# Patient Record
Sex: Female | Born: 1950 | Race: White | Hispanic: No | Smoking: Former smoker
Health system: Southern US, Community
[De-identification: ages and names within clinical notes are randomized; demographics above are authoritative.]

## PROBLEM LIST (undated history)

## (undated) DIAGNOSIS — F32A Depression, unspecified: Secondary | ICD-10-CM

## (undated) DIAGNOSIS — Z973 Presence of spectacles and contact lenses: Secondary | ICD-10-CM

## (undated) DIAGNOSIS — Z720 Tobacco use: Secondary | ICD-10-CM

## (undated) DIAGNOSIS — F329 Major depressive disorder, single episode, unspecified: Secondary | ICD-10-CM

## (undated) DIAGNOSIS — J439 Emphysema, unspecified: Secondary | ICD-10-CM

## (undated) DIAGNOSIS — F419 Anxiety disorder, unspecified: Secondary | ICD-10-CM

## (undated) DIAGNOSIS — Z972 Presence of dental prosthetic device (complete) (partial): Secondary | ICD-10-CM

## (undated) DIAGNOSIS — G4700 Insomnia, unspecified: Secondary | ICD-10-CM

## (undated) HISTORY — PX: WISDOM TOOTH EXTRACTION: SHX21

## (undated) HISTORY — DX: Emphysema, unspecified: J43.9

## (undated) HISTORY — DX: Tobacco use: Z72.0

## (undated) HISTORY — DX: Insomnia, unspecified: G47.00

## (undated) HISTORY — PX: TOOTH EXTRACTION: SUR596

## (undated) HISTORY — DX: Major depressive disorder, single episode, unspecified: F32.9

## (undated) HISTORY — DX: Anxiety disorder, unspecified: F41.9

## (undated) HISTORY — DX: Depression, unspecified: F32.A

---

## 2007-06-18 ENCOUNTER — Ambulatory Visit: Payer: Self-pay | Admitting: Cardiovascular Disease

## 2007-06-18 ENCOUNTER — Inpatient Hospital Stay (HOSPITAL_COMMUNITY): Admission: EM | Admit: 2007-06-18 | Discharge: 2007-06-20 | Payer: Self-pay | Admitting: Emergency Medicine

## 2007-06-29 ENCOUNTER — Ambulatory Visit: Payer: Self-pay

## 2007-07-08 ENCOUNTER — Ambulatory Visit: Payer: Self-pay | Admitting: Internal Medicine

## 2008-10-07 ENCOUNTER — Emergency Department: Payer: Self-pay

## 2009-04-19 ENCOUNTER — Ambulatory Visit (HOSPITAL_COMMUNITY): Admission: RE | Admit: 2009-04-19 | Discharge: 2009-04-19 | Payer: Self-pay | Admitting: Internal Medicine

## 2009-04-24 ENCOUNTER — Other Ambulatory Visit: Admission: RE | Admit: 2009-04-24 | Discharge: 2009-04-24 | Payer: Self-pay | Admitting: Internal Medicine

## 2010-01-15 ENCOUNTER — Encounter (INDEPENDENT_AMBULATORY_CARE_PROVIDER_SITE_OTHER): Payer: Self-pay | Admitting: *Deleted

## 2010-05-10 ENCOUNTER — Encounter (INDEPENDENT_AMBULATORY_CARE_PROVIDER_SITE_OTHER): Payer: Self-pay | Admitting: *Deleted

## 2010-09-22 ENCOUNTER — Encounter: Payer: Self-pay | Admitting: Internal Medicine

## 2010-10-01 NOTE — Letter (Signed)
Summary: Pre Visit No Show Letter  [facility]  [address] [place], [place] [address]   Phone: [phone]  Fax: [phone]        May 10, 2010 MRN: [id number]    Jordan Cantu [address] [place], [place]  [address]    Dear [name],   We have been unable to reach you by phone concerning the pre-procedure visit that you missed on  05/10/2010. For this reason,your procedure scheduled on Friday 05/24/2010 has been cancelled. Our scheduling staff will gladly assist you with rescheduling your appointments at a more convenient time. Please call our office at [phone] between the hours of 8:00am and 5:00pm, press option #2 to reach an appointment scheduler. Please consider updating your contact numbers at this time so that we can reach you by phone in the future with schedule changes or results.    Thank you,    Ezra Sites RN [facility]

## 2010-10-01 NOTE — Letter (Signed)
Summary: Previsit letter  [facility]  [address] [place], [place] [address]   Phone: [phone]  Fax: [phone]       01/15/2010 MRN: [id number]  Jordan Cantu [address] [place], [place]  [address]  Dear Ms. [name],  Welcome to the Gastroenterology Division at [facility]    You are scheduled to see a nurse for your pre-procedure visit on 01-22-10 at 8:00a.m. on the 3rd floor at [facility], [address]  We ask that you try to arrive at our office 15 minutes prior to your appointment time to allow for check-in.  Your nurse visit will consist of discussing your medical and surgical history, your immediate family medical history, and your medications.    Please bring a complete list of all your medications or, if you prefer, bring the medication bottles and we will list them.  We will need to be aware of both prescribed and over the counter drugs.  We will need to know exact dosage information as well.  If you are on blood thinners (Coumadin, Plavix, Aggrenox, Ticlid, etc.) please call our office today/prior to your appointment, as we need to consult with your physician about holding your medication.   Please be prepared to read and sign documents such as consent forms, a financial agreement, and acknowledgement forms.  If necessary, and with your consent, a friend or relative is welcome to sit-in on the nurse visit with you.  Please bring your insurance card so that we may make a copy of it.  If your insurance requires a referral to see a specialist, please bring your referral form from your primary care physician.  No co-pay is required for this nurse visit.     If you cannot keep your appointment, please call [phone] to cancel or reschedule prior to your appointment date.  This allows [place] the opportunity to schedule an appointment for another patient in need of care.    Thank you for choosing [facility] for your medical  needs.  We appreciate the opportunity to care for you.  Please visit [place] at our website  to learn more about our practice.                     Sincerely.                                                                                                                   The Gastroenterology Division

## 2011-01-14 NOTE — H&P (Signed)
NAME:  Jordan Cantu, COZZA.:  [id number]   MEDICAL RECORD NO.:  [id number]          PATIENT TYPE:  EMS   LOCATION:  MAJO                         FACILITY:  [facility]   PHYSICIAN:  Hillery Aldo, M.D.   DATE OF BIRTH:  1951-03-03   DATE OF ADMISSION:  06/18/2007  DATE OF DISCHARGE:                              HISTORY & PHYSICAL   PRIMARY CARE PHYSICIAN:  Dr. Loma Sender in [place], [place]  [place].   CHIEF COMPLAINT:  Chest pain radiating to the jaw.   HISTORY OF PRESENT ILLNESS:  The patient is a 60 year old female tobacco  abuser who presents with the sudden onset of chest pain, nonexertional,  while at work yesterday.  The patient states that the pain lasted  approximately 10-15 minutes and was substernal and sharp in quality.  The pain radiated up to both sides of her jaw.  It was associated with  some diaphoresis, but no nausea or vomiting.  No shortness of breath.  She now had some residual soreness of the chest, and her family has  persuaded her to come into the emergency department for evaluation.  She  is currently chest pain free.  She is being admitted for further  evaluation and workup.   PAST MEDICAL HISTORY:  Anxiety.   FAMILY HISTORY:  The patient's mother is alive at age 36 and suffers  with hypertension.  The patient's father died at 41 from complications  of chronic obstructive pulmonary disease.  She has two healthy siblings.   SOCIAL HISTORY:  The patient is divorced and currently lives with her  mother.  She smokes a pack of tobacco daily and has done so for 35  years.  She drinks 1-2 alcoholic beverages daily.  She denies any drug  use.  She works at [employer] as a Occupational psychologist.   ALLERGIES:  NO KNOWN DRUG ALLERGIES.   CURRENT MEDICATIONS:  1. Xanax 0.5 mg b.i.d.  2. Amitriptyline 100 mg at bedtime.  3. Aspirin 81 mg daily.  4. Baclofen 10 mg q.6 h. p.r.n.   REVIEW OF SYSTEMS:  The patient denies any fever or  chills.  She has a  longstanding fair appetite.  There has been some unintentional weight  loss, approximately 10-15 pounds over the past 3 months.  She does have  dyspnea on exertion and a smoker's cough.  She denies any nausea or  vomiting.  She denies any changes in her bowel habits.  She denies  melena, hematochezia, dysuria, and decreased energy.   PHYSICAL EXAMINATION:  VITAL SIGNS:  Temperature 97.4, pulse 95,  respirations 20, blood pressure 177/94, O2 saturation 100% on room air.  GENERAL:  This is a well-developed, well-nourished female who is in no  acute distress.  HEENT: Normocephalic, atraumatic.  PERRL.  EOMI.  Oropharynx is clear.  NECK:  Supple, no thyromegaly, no lymphadenopathy, no jugular venous  distention.  CHEST:  The patient has crackles to the right base.  Decreased breath  sounds throughout.  HEART:  Regular rate and rhythm.  No murmurs, rubs, or gallops.  ABDOMEN:  Soft, nontender, nondistended,  with normoactive bowel sounds.  EXTREMITIES:  No clubbing, edema, cyanosis.  SKIN:  Warm and dry.  No rashes.  NEUROLOGIC:  The patient is alert and oriented x3.  Cranial nerves II-  XII are grossly intact.  Moves all extremities x4 with equal strength.  Nonfocal.   DATA REVIEW:  Chest x-ray shows changes of COPD/emphysema.  Likely  nipple shadows, but otherwise nonacute.   A 12-lead EKG shows normal sinus rhythm at 93 beats per minute, with  changes of left atrial enlargement.   LABORATORY DATA:  White blood cell count is 6.8, hemoglobin 14.5,  hematocrit 42.7, platelets 273.  Sodium is 138, potassium 3.8, chloride  103, bicarbonate 30, BUN 9, creatinine 0.9, glucose 88.  Point of care  cardiac markers are negative x1.   ASSESSMENT AND PLAN:  1. Chest pain:  The patient's chest pain is likely related to a      pulmonary process as opposed to a cardiac process.  Nevertheless,      she does have some risk factors including hypertension on      presentation that  likely has not been treated and ongoing tobacco      abuse.  We will therefore admit her to the telemetry unit and cycle      cardiac enzymes q.8 h. x3 sets.  Given her unintentional weight      loss, history of tobacco abuse, pleuritic-type chest pain, and      changes of COPD, I will obtain a CT angiogram of the chest to rule      out malignancy or pulmonary embolism.  The patient has dyspnea on      exertion but no current dyspnea.  If she becomes short of breath,      we will start her on bronchodilator therapy.  Will use Robitussin      DM p.r.n. for her cough.  2. Hypertension:  The patient's blood pressure is 177/94, clearly      within the hypertensive range.  She has not been diagnosed with      hypertension in the past.  Will start her on Toprol-XL 25 mg daily.  3. Ongoing tobacco abuse:  Extensive counseling has been done with the      patient regarding the importance of tobacco cessation, given her      chest x-ray changes.  She is agreeable to trying Chantix for help      with tobacco cessation.  Will start her on Chantix and titrate this      up at discharge.  We will start her on a nicotine patch for tobacco      craving.  4. Anxiety:  Continue the patient's amitriptyline and Xanax as she was      taking as an outpatient.  5. Prophylaxis:  Initiate GI prophylaxis with Protonix and DVT      prophylaxis with Lovenox.      Hillery Aldo, M.D.  Electronically Signed     CR/MEDQ  D:  06/18/2007  T:  06/21/2007  Job:  [id number]   cc:   Loma Sender

## 2011-01-14 NOTE — Assessment & Plan Note (Signed)
[facility] OFFICE NOTE   Jordan, Cantu                         MRN:          [id number]  DATE:07/08/2007                            DOB:          Mar 19, 1951    PRIMARY CARE PHYSICIAN:  Jordan Cantu.   INTERVAL HISTORY:  Jordan Cantu is a very pleasant 60 year old woman with  a history of COPD and anxiety.  She was admitted to [facility]  in October with chest pain.  She underwent a CAT scan which showed no  evidence of pulmonary embolus but there were multiple small pulmonary  nodules as well as some hilar adenopathy.  She ruled out for a  myocardial infarction with serial cardiac markers.  She was seen by Dr.  Eden Cantu, who felt that she was likely low risk for myocardial ischemia.  She was set up for an outpatient exercise Myoview.  She walked for 8  minutes, stopped due to fatigue.  There were no ST changes.  The images  were not gated due to PVCs.  However, perfusion was totally normal.  Since that time, she says she feels very good.  She denies any chest  pain or shortness of breath.  She is able to do all her activities  without any limitations.  She is obviously somewhat concerned about her  pulmonary nodules and is due for a repeat CAT scan in 6 months.  She did  stop taking her Toprol as she was worried about the side effects and it  made her fatigued.   CURRENT MEDICATIONS:  1. Xanax 0.5 mg b.i.d.  2. Amitriptyline 100 mg at night.  3. Aspirin 81 a day.   PHYSICAL EXAMINATION:  GENERAL:  She is a thin female in no acute  distress, ambulates around the clinic without any respiratory  difficulty.  VITAL SIGNS:  Blood pressure is 118/78, heart rate is 100, weight is  110.  HEENT:  Normal.  NECK:  Supple.  No  JVD.  Carotids are 2 plus bilaterally without any  bruits.  There is no lymphadenopathy or thyromegaly.  CARDIAC:  PMI is nondisplaced.  She is regular with no murmur or rub,  no  gallop.  LUNGS:  Clear with reduced air movement throughout.  No wheezing.  ABDOMEN:  Soft, nontender, nondistended.  No hepatosplenomegaly.  No  bruits.  No masses.  Good bowel sounds.  EXTREMITIES:  Warm with no cyanosis, clubbing, or edema, no ulceration.  Distal pulses are 1 plus bilaterally.  NEUROLOGIC:  Alert and oriented x3.  Cranial nerves II-XII are intact.  Moves all 4 extremities without difficulty.  Affect is pleasant.   EKG shows a sinus tachycardia at a rate of 103.  There is biatrial  enlargement.  No evidence of ischemia.   ASSESSMENT/PLAN:  1. Chest pain.  This is resolved.  Given her stress test, I suspect      this was noncardiac.  She does not need any further workup.  2. Abnormal EKG.  She does have mild biatrial enlargement.  I think  this is nonspecific; however, should she develop some shortness of      breath, I do think it would be reasonable to get a screening 2D      echo to make sure she does not have any pulmonary hypertension or      other abnormalities.  Some of this may be due just to the fact that      she is so thin.  3. Chronic obstructive pulmonary disease.  She has quit smoking now      for 3 weeks.  I congratulated her on this.  4. Pulmonary nodules.  She will need a followup CT.  This will be      followed by Jordan Cantu.   DISPOSITION:  We will see her back on a p.r.n. basis.     Jordan Buckles. Bensimhon, MD  Electronically Signed    DRB/MedQ  DD: 07/08/2007  DT: 07/08/2007  Job #: [id number]   cc:   Loma Cantu

## 2011-01-14 NOTE — Discharge Summary (Signed)
NAME:  Jordan Cantu, Jordan Cantu NO.:  [id number]   MEDICAL RECORD NO.:  [id number]          PATIENT TYPE:  INP   LOCATION:  4735                         FACILITY:  [facility]   PHYSICIAN:  Michaelyn Barter, M.D. DATE OF BIRTH:  April 14, 1951   DATE OF ADMISSION:  06/18/2007  DATE OF DISCHARGE:  06/20/2007                               DISCHARGE SUMMARY   PRIMARY CARE PHYSICIAN:  Unassigned.   FINAL DIAGNOSES:  1. Chest pain  2. Multiple lung nodules seen on chest CT.  3. Cigarette abuse.   CONSULTATIONS:  [place] [facility] with Dr. Eden Emms.   HISTORY OF PRESENT ILLNESS:  Jordan Cantu is a 60 year old female who  indicated that she had experienced at least 2 episodes of sudden-onset  chest pain over the week or two leading up to this admission.  The day  prior to this admission, the patient was at work and while at work and  talking to another individual, she began to have centrally-located chest  discomfort.  She indicates that she works at [employer] and that her  work is physically non-taxing.  Shortly after the chest pain started,  she had to leave her job and she walked to her car and relaxed for  several minutes.  During this time frame, her chest pain began to  subside.  She was then able to return to her job and complete her daily  activity.   PAST MEDICAL HISTORY:  Please see that dictated by Dr. Hillery Aldo.   HOSPITAL COURSE:  1. Chest pain.  A 12-lead EKG was revealed normal sinus rhythm, no      obvious T-waves or ST segment abnormalities were seen.  A chest x-      ray was completed on October 17 which revealed emphysema without      acute disease.  A CT scan of the patient's chest was found to be      negative for pulmonary embolus or aortic dissection.  The patient's      cardiac enzymes were cycled.  Her Troponin-I was found to be 0.01,      less than 0.01, and finally 0.01.  Her CK-MBs were found to be      normal at 1.2, 1.1, and 1.4, therefore the  patient ruled out for      myocardial infarction.  She did not have any recurrence of her      chest pain or shortness of breath.  Cardiology was consulted in      light of the patient's description of her chest pain with radiation      to her neck, and in light of her having an extensive history of      cigarette smoking.  [facility]'s Dr. Charlton Haws has seen the patient      and they have arranged for the patient to undergo a treadmill      stress test in the near future as an outpatient.  2. Pulmonary nodules seen on CT scan.  The patient does admit to an      extensive history of cigarette smoking.  A  CT scan of the patient's      chest done on October 17,  revealed hilar adenopathy, greatest on      the right, measuring up to 1.1 x 1.7 cm, multiple left lower lobe      pulmonary nodules measuring 9 x 5.8 mm.  Within the right major      fissure, there was a 9.5 x 4.8 mm nodule noted which may represent      a lymph node.  I reviewed these results with the radiologist.  It      was recommended that the patient have a repeat CT scan within the      next 6 months.  This information has been relayed to the patient.  3. History of cigarette abuse.  The patient indicates that she has      smoked over a pack of cigarettes per day for at least 35 years.      Chantix was started while the patient was in the hospital.  The      patient has been counseled regarding her need to stop smoking      cigarettes.   CONDITION ON DISCHARGE:  Appears to be improved.  The patient currently  denies having any chest pain or shortness of breath.  She requests to be  discharged home.  Her vitals:  Her temperature is 97.9, heart rate 92,  respirations 18, blood pressure 104/76, O2 saturation is 100% on room  air.  The decision has been made to discharge the patient home.  The  patient will be discharged home on Chantix 0.5 mg 1 tablet p.o. b.i.d.  She will be told to follow up with her primary care physician.   She will  also be told to stop smoking cigarettes.  She will be allowed to resume  all of her previously prescribed medications and she will be told to  follow up with [facility] cardiology for a treadmill stress test.      Michaelyn Barter, M.D.  Electronically Signed     OR/MEDQ  D:  06/20/2007  T:  06/20/2007  Job:  [id number]

## 2011-01-14 NOTE — Consult Note (Signed)
NAME:  Jordan, Cantu NO.:  [id number]   MEDICAL RECORD NO.:  [id number]          PATIENT TYPE:  INP   LOCATION:  4735                         FACILITY:  [facility]   PHYSICIAN:  Noralyn Pick. Eden Emms, MD, FACCDATE OF BIRTH:  1951/01/31   DATE OF CONSULTATION:  06/20/2007  DATE OF DISCHARGE:                                 CONSULTATION   PRIMARY CARE PHYSICIAN:  Dr. Loma Sender in [place], [place].   REFERRING PHYSICIAN:  Dr. Roxan Hockey from [facility].   CARDIOLOGIST:  She will be new to our group and we will establish her in  our [place] office.   REASON FOR REFERRAL:  Chest pain.   HISTORY OF PRESENT ILLNESS:  Jordan Cantu is a 60 year old female patient  with no history of CAD presented to the [facility] 2 days ago with complaints of chest pain.  She has noted at least  three to four episodes of chest pain over the last few months.  She  notes that the pain comes on at rest.  It usually happens while she is  at work which is a very stressful job.  It is a substernal pain that she  describes as sharp.  She has radiation up into her jaw that she  describes as feeling like a toothache.  She has no associated shortness  of breath or nausea.  She has noted some diaphoresis.  The pain usually  lasts about 10 minutes and goes away on its own.  Her pain is  nonexertional.  She denies any exertional chest pain or jaw pain.  She  does note mild dyspnea on exertion that she relates to her cigarette  smoking.  She really describes NYHA Class I to II symptoms.  She denies  orthopnea, PND or pedal edema.  She denies palpitations.  She denies  syncope.  Her family was concerned and eventually urged her to come to  the emergency room two nights ago.  Thus far, she was ruled out for  myocardial function by enzymes.  Her initial EKG was normal.  She has  had a chest CT that was negative for pulmonary embolism but this does  reveal multiple left lower lobe nodules and right hilar adenopathy as  well as a large lymph node in the right major fissure.   PAST MEDICAL HISTORY:  1. Insomnia.  2. Anxiety.   CURRENT MEDICATIONS:  1. Lovenox 40 mg daily.  2. Aspirin 81 mg daily.  3. Protonix 40 mg daily.  4. Amitriptyline 100 mg nightly.  5. Nicotine patch.  6. Chantix.  7. Toprol XL 25 mg daily.   ALLERGIES:  NO KNOWN DRUG ALLERGIES.   SOCIAL HISTORY:  [place]  She is  divorced with two children.  She has a 35-pack-year history of smoking.  She denies heavy alcohol use.  Denies any drug abuse.  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   FAMILY HISTORY:  Insignificant for premature CAD.  Her mother is still  alive at  age 94.  Her father died from complications of COPD at age 28.   REVIEW OF SYSTEMS:  Please see HPI.  Denies any fevers, chills, melena,  hematochezia, hematuria, or dysuria.  Denies any monocular blindness,  unilateral weakness, difficulty with speech, facial drooping.  She  denies any claudication.  She does have a chronic cough.  She denies  hemoptysis.  Rest of the review of systems are negative.   PHYSICAL EXAMINATION:  GENERAL APPEARANCE:  She is a well-nourished,  well-developed female in no acute distress.  VITAL SIGNS:  Blood pressure 113/75, pulse 83, respirations 20,  temperature 97.1, oxygen saturation 98% on room air.  HEENT:  Normal.  NECK:  Without JVD.  LYMPHS:  Without lymphadenopathy.  ENDOCRINE:  Without thyromegaly.  CARDIAC:  Normal S1 and S2.  Regular rate and rhythm without murmurs,  clicks, rubs or gallops.  LUNGS:  Clear to auscultation bilaterally without wheezing, rhonchi or  rales.  ABDOMEN:  Soft and nontender with normoactive bowel sounds.  No  organomegaly.  EXTREMITIES:  Without edema.  Calves soft, nontender.  SKIN:  Warm and dry.  NEUROLOGIC:  She is alert and oriented x3.  Cranial  nerves II-XII  grossly intact.  VASCULAR:  No carotid bruits bilaterally.  Dorsalis pedis and posterior  tibial pulses 2+ bilaterally.   Electrocardiogram reveals sinus rhythm with heart rate of 93, normal  axis, left atrial enlargement.  No ischemic changes.   Chest x-ray shows emphysema without acute disease.  CT angiography of  the chest revealed no pulmonary emboli and multiple left lower lobe  nodules, right hilar adenopathy, large lymph node in the right major  fissure - question malignancy.   LABORATORY DATA:  White count 6800, hemoglobin 15.6, hematocrit 46,  platelet count of 273,000.  Sodium 138, potassium 3.8, BUN 9, creatinine  0.9, glucose 88.  CK-MB negative x3.  Troponin I negative x3.  TSH  2.445.  BNP less than 30.  Total cholesterol 165, triglycerides 80, HDL  42, LDL 107.   IMPRESSION:  1. Chest pain.  2. Chronic obstructive pulmonary disease/emphysema.  3. Lung nodules.      a.     Needs follow-up.  4. Tobacco abuse.  5. Mild dyslipidemia.   PLAN:  The patient presents with chest pain with atypical greater than  typical features.  Her cardiac risk factors are small and include only  age, gender and tobacco abuse.  Myocardial infarction has been ruled  out.  She does have lung nodules noted on chest CT which needs to be  followed up with serial CT scans per radiology.  She needs to  discontinue smoking and she is motivated to do this.  Currently she is  on nicotine patches as well as Chantix.  We  will most likely proceed with outpatient stress Myoview in our  [place] office and the patient will be established with one of our  doctors in [place], [place], which is closer to her home.  She  will need follow-up post stress testing.  She has not had a follow-up  EKG since her admission.  We will go ahead and obtain that now.      Jordan Newcomer, PA-C      Peter C. Eden Emms, MD, [facility]  Electronically Signed    SW/MEDQ  D:  06/20/2007  T:   06/21/2007  Job:  [id number]   cc:   Loma Sender

## 2011-06-11 LAB — CARDIAC PANEL(CRET KIN+CKTOT+MB+TROPI)
Relative Index: INVALID
Relative Index: INVALID
Troponin I: 0.01
Troponin I: <0.01

## 2011-06-11 LAB — I-STAT 8, (EC8 V) (CONVERTED LAB)
HCT: 46
Operator id: 288331
Potassium: 3.8
TCO2: 31
pCO2, Ven: 54.9 — ABNORMAL HIGH
pH, Ven: 7.342 — ABNORMAL HIGH

## 2011-06-11 LAB — DIFFERENTIAL
Basophils Absolute: 0
Eosinophils Absolute: 0.1
Eosinophils Relative: 1
Lymphs Abs: 1.7
Monocytes Relative: 6
Neutrophils Relative %: 69

## 2011-06-11 LAB — TSH: TSH: 2.445

## 2011-06-11 LAB — LIPID PANEL
Cholesterol: 165
HDL: 42
Total CHOL/HDL Ratio: 3.9
VLDL: 16

## 2011-06-11 LAB — POCT CARDIAC MARKERS
CKMB, poc: <1 — ABNORMAL LOW
Myoglobin, poc: 63.8
Troponin i, poc: <0.05

## 2011-06-11 LAB — HEPATIC FUNCTION PANEL
ALT: 13
Alkaline Phosphatase: 95
Bilirubin, Direct: 0.1

## 2011-06-11 LAB — CBC
HCT: 42.7
MCHC: 34.1
Platelets: 273
WBC: 6.8

## 2011-06-11 LAB — TROPONIN I: Troponin I: 0.01

## 2012-11-20 ENCOUNTER — Emergency Department: Payer: Self-pay | Admitting: Emergency Medicine

## 2012-11-23 ENCOUNTER — Emergency Department: Payer: Self-pay | Admitting: Emergency Medicine

## 2012-12-02 ENCOUNTER — Emergency Department: Payer: Self-pay | Admitting: Emergency Medicine

## 2012-12-05 ENCOUNTER — Emergency Department: Payer: Self-pay | Admitting: Emergency Medicine

## 2014-09-01 HISTORY — PX: NOSE SURGERY: SHX723

## 2014-09-03 ENCOUNTER — Emergency Department: Payer: Self-pay | Admitting: Emergency Medicine

## 2014-09-03 LAB — CBC
HCT: 46.1 % (ref 35.0–47.0)
HGB: 15.4 g/dL (ref 12.0–16.0)
MCH: 33.5 pg (ref 26.0–34.0)
MCHC: 33.5 g/dL (ref 32.0–36.0)
MCV: 100 fL (ref 80–100)
PLATELETS: 297 10*3/uL (ref 150–440)
RBC: 4.61 10*6/uL (ref 3.80–5.20)
RDW: 13.3 % (ref 11.5–14.5)
WBC: 9 10*3/uL (ref 3.6–11.0)

## 2014-09-03 LAB — PRO B NATRIURETIC PEPTIDE: B-TYPE NATIURETIC PEPTID: 58 pg/mL (ref 0–125)

## 2014-09-03 LAB — BASIC METABOLIC PANEL
ANION GAP: 6 — AB (ref 7–16)
BUN: 8 mg/dL (ref 7–18)
CHLORIDE: 103 mmol/L (ref 98–107)
CO2: 28 mmol/L (ref 21–32)
Calcium, Total: 9.3 mg/dL (ref 8.5–10.1)
Creatinine: 0.95 mg/dL (ref 0.60–1.30)
EGFR (African American): >60
EGFR (Non-African Amer.): >60
Glucose: 88 mg/dL (ref 65–99)
OSMOLALITY: 272 (ref 275–301)
Potassium: 3.7 mmol/L (ref 3.5–5.1)
Sodium: 137 mmol/L (ref 136–145)

## 2014-09-03 LAB — TROPONIN I: Troponin-I: <0.02 ng/mL

## 2014-11-11 ENCOUNTER — Ambulatory Visit: Payer: Self-pay | Admitting: Family Medicine

## 2014-11-11 ENCOUNTER — Emergency Department: Payer: Self-pay | Admitting: Student

## 2014-11-12 ENCOUNTER — Inpatient Hospital Stay: Payer: Self-pay | Admitting: Internal Medicine

## 2014-12-31 NOTE — H&P (Signed)
PATIENT NAME:  Jordan Cantu, Jordan Cantu MR#:  [id number] DATE OF BIRTH:  [birth year]  DATE OF ADMISSION:  11/12/2014  PRIMARY CARE PHYSICIAN: Dr. Hardin Negus.  REFERRING PHYSICIAN: Dr. Dahlia Client.   CHIEF COMPLAINT: Nose bleeding for 2 days.   HISTORY OF PRESENT ILLNESS: A 64 year old, Caucasian female with a history of COPD and anxiety comes to the ED due to nose bleeding for 2 days. The patient is alert, awake, oriented, in no acute distress. The patient had said she started to have nosebleed 2 days ago, which has been worsening. She came to the ED last night, nose was packed and she was discharged to home; however, the patient still had nose bleeding which restarted this morning. In addition, the patient feels weak, headache and dizziness. She said that she almost passed out, so she was sent to the ED. ENT physician, Dr. Kathyrn Sheriff did bilateral nasal packing in the ED, but the patient still has bleeding from the nose. The patient denies any chest pain or palpitation. Denies any cough, sputum or shortness of breath. She has no abdominal pain, nausea, vomiting, but has melena, which is possibly due to swallowing blood from the nose bleeding. The patient denies any other bleeding. No hematuria or bloody stool.    PAST MEDICAL HISTORY: COPD and anxiety.   SOCIAL HISTORY: Smokes 1 pack a day for many years. Social alcohol drinking. No drug abuse.   FAMILY HISTORY: Brother had a stroke. Sister has heart disease.   ALLERGIES: POLLEN.  HOME MEDICATIONS: Xanax 0.5 mg 3 times a day.  REVIEW OF SYSTEMS:  CONSTITUTIONAL: The patient denies any fever or chills, but has headache, dizziness and generalized weakness.  EYES: No double vision or blurred vision.  EARS, NOSE AND THROAT: No postnasal drip, slurred speech or dysphagia but has severe epistaxis.  CARDIOVASCULAR: No chest pain, palpitations, orthopnea or nocturnal dyspnea. No leg edema.  PULMONARY: No cough, sputum, shortness of breath or hematemesis.   GASTROINTESTINAL: No abdominal pain, nausea, vomiting or diarrhea. Positive for melena but no bloody stool.  GENITOURINARY: No dysuria, hematuria or incontinence.  SKIN: No rash or jaundice.  NEUROLOGY: No syncope, loss of consciousness or seizure, but has had near syncope due to dizziness and weakness.  HEMATOLOGY: No easy bruising or bleeding, except for this epistaxis.  ENDOCRINE: No polyuria or polydipsia, heat or cold intolerance.   PHYSICAL EXAMINATION: VITAL SIGNS: Temperature 97.8, blood pressure 139/76, pulse 114, O2 saturation 100% on room air.  GENERAL: The patient is alert, awake, oriented, in no acute distress. Actively bleeding from nose.  HEENT: Pupils round, equal and reactive to light and accommodation. Moist oral mucosa. Clear oropharynx. A lot of bleeding from the nose, even on packing. Patient suctioned a lot of fresh blood from mouth and nose, which was about 230 mL in the ED. NECK: Supple. No JVD or carotid bruit. No lymphadenopathy. No thyromegaly.   CARDIOVASCULAR: S1 and S2. Regular rate, tachycardia, no murmurs or gallops.  PULMONARY: Bilateral air entry. No wheezing or rales. No use of accessory muscles to breathe.  ABDOMEN: Soft. No distention. No tenderness. No organomegaly. Bowel sounds present.  EXTREMITIES: No edema, clubbing or cyanosis. No calf tenderness. Bilateral pedal pulses present.  SKIN: No rash or jaundice.  NEUROLOGY: A and  O x 3. No focal deficits. Power 5/5. Sensation intact.   LABORATORY DATA: Glucose 154, BUN 23, creatinine 0.97. Electrolytes normal. WBC 11.5. Hemoglobin was 8 in the ED and now is 6.8.   IMPRESSIONS: 1. Severe epistaxis.  2. Anemia due to acute blood loss.  3. Chronic obstructive pulmonary disease, stable.  4. Anxiety.  5. Tobacco abuse.   PLAN OF TREATMENT: 1. The patient is being admitted to the medical floor. Dr. Kathyrn Sheriff, the ear, nose and throat physician will take the patient for to operating room for further  procedure to stop the nose bleeding.  2. For anemia of acute blood loss, I will get a packed red blood cell transfusion, 2 units and monitor her hemoglobin every 6 hours.  3. For anxiety, we will continue Xanax.  4. For tobacco abuse, smoking cessation was counseled for 4 minutes. I will give a nicotine patch.  5. For chronic obstructive pulmonary disease, the patient's chronic obstructive pulmonary disease is stable. I will give DuoNebs p.r.n.  6. I discussed the patient's condition and plan of treatment with the patient and the patient's daughter.   TIME SPENT: About 63 minutes.    ____________________________ Demetrios Loll, MD qc:TT D: 11/12/2014 11:31:02 ET T: 11/12/2014 13:03:20 ET JOB#: [id number]  cc: Demetrios Loll, MD, <Dictator> Demetrios Loll MD ELECTRONICALLY SIGNED 11/12/2014 15:15

## 2014-12-31 NOTE — Op Note (Signed)
PATIENT NAME:  Jordan Cantu, Jordan Cantu MR#:  [id number] DATE OF BIRTH:  09/15/1950  DATE OF PROCEDURE:  11/12/2014  PREOPERATIVE DIAGNOSIS: Uncontrolled epistaxis.  POSTOPERATIVE DIAGNOSIS: Uncontrolled epistaxis.  OPERATIVE PROCEDURE: Endoscopic control of right-sided nasal epistaxis.   SURGEON: Huey Romans, MD   ANESTHESIA: General.   COMPLICATIONS: None.  TOTAL ESTIMATED BLOOD LOSS: 50 mL.   PROCEDURE: The patient was taken to the operating room and placed under general anesthesia. She had packing in both sides of her nose. Original bleed was on the right side. The 0-degree endoscope was readied. The left packing was gently removed. Blood was removed from the nose. There was no obvious bleeding that I could see in the left side of the nose. There was a large clot posteriorly in the nasopharynx that would not come out and seemed like it was attached to something back there. I then removed the packing from the right nostril and again searched for bleeding. I could see some fresh blood coming from the back of the nose. I removed all the clots in the back of the nose, and with the 0-degree scope could see bleeding coming from around the lateral side of the distal end of the posterior border of the middle turbinate. I used some 1% Xylocaine with epinephrine 1:100,000 for infiltration into the posterior border of the middle turbinate, and I used cottonoid pledgets soaked in phenylephrine and Xylocaine for vasoconstriction in this area. There is still oozing coming from medial to the turbinate. I could not see exactly where it was. I infractured the middle turbinate to be able to get the scope into the middle meatus better and see the area. Bleeding was very distally right down near the attachment of the middle turbinate to the lateral wall. I used suction cautery here to cauterize this area. I also cauterized a few small areas along the middle turbinate and the middle meatus, as well, to try to make sure  all bleeding was controlled here. I revisualized both sides several times to make sure I could not see any other bleeding sites anywhere else. I then used Arista powder and powdered the middle meatus area and then took some xerogel and placed it in the middle meatus and soaked this with saline. There was no further bleeding noted on either side, and the airway was open. The patient tolerated the procedure well. This was done at the bedside. There were no operative complications.   ____________________________ Huey Romans, MD phj:ST D: 11/12/2014 14:18:32 ET T: 11/12/2014 23:22:44 ET JOB#: [id number]  cc: Huey Romans, MD, <Dictator> Huey Romans MD ELECTRONICALLY SIGNED 11/20/2014 10:44

## 2014-12-31 NOTE — Consult Note (Signed)
PATIENT NAME:  NEWELL, FRATER MR#:  [id number] DATE OF BIRTH:  [birth year]  DATE OF CONSULTATION:  11/12/2014  REFERRING PHYSICIAN:   CONSULTING PHYSICIAN:  Huey Romans, MD  REASON FOR CONSULTATION: Uncontrolled epistaxis.   HISTORY OF PRESENT ILLNESS: The patient is a 64 year old white female who started having some bleeding on and off Friday morning. She was at rest at home and then stopped, but then it will restart again. She eventually presented to the Emergency Room this past evening and had most of her bleeding on her right side. She had a packing placed in her right nostril by the ER physician and then started having some blood come out of right left side and then had a pack placed in her left side. Her blood pressure was up a little bit. It continued to ooze some, and consultation was placed for further evaluation.   REVIEW OF SYSTEMS: The patient has had some vomiting of blood. She is lightheaded. She has had a little bit of a headache and even felt like she was having some chills. She does not have any black stools. No chest pain. She is not having a sore throat. No excessive bruising. She is having some anxiety and dizziness when she is up and around.   PAST MEDICAL HISTORY: Significant for emphysema.   CURRENT MEDICATIONS: Alprazolam for anxiety.   DRUG ALLERGIES: None.   SOCIAL HISTORY: She quit smoking several months ago. She has alcohol occasionally.    PHYSICAL EXAMINATION:  HEENT: The patient has bilateral Merocel sponge packs in her nose. They are all the way back on both sides. They have some pink-tinged staining in them. When she dabs, there is serosanguineous mucus that comes from them. There is no fresh blood occurring. The oropharynx shows no signs of any drip or blood coming down the back of her throat.  NECK: Negative for any nodes or masses. She has no bruising around her face or neck.  VITAL SIGNS: Her blood pressure has stabilized at 138/82.   IMPRESSION: The  patient has had uncontrolled epistaxis. It seems as though her blood pressure is stable, and the bleeding is now controlled. The serosanguineous drainage is more from mucus trying to wash out the sponge packs, and this will continue. We will put a drip dressing on so she will quit dabbing at it and playing with the sponge packs. Her hemoglobin had dropped, and she is going to be admitted by the hospitalist service to potentially give her a transfusion. She hopefully will not need any further dealing with the nosebleed. We will plan to leave the sponge packs in for 5 days, and she should be discharged home before that. We will see her in the office for removal of the packing. If she has any further acute signs of bleeding, then we would likely have to take her to the operating room to try to find the source and control it directly as she already has packing in bilaterally. The patient understands this and is going to stay at bed rest and relax to help prevent further bleeding for now and will make an appointment in our office in 5 days for packing removal.   ____________________________ Huey Romans, MD phj:JT D: 11/12/2014 08:12:18 ET T: 11/12/2014 13:47:33 ET JOB#: [id number]  cc: Huey Romans, MD, <Dictator> Huey Romans MD ELECTRONICALLY SIGNED 11/12/2014 14:42

## 2014-12-31 NOTE — Discharge Summary (Signed)
PATIENT NAME:  Jordan Cantu, Jordan Cantu MR#:  [id number] DATE OF BIRTH:  August 27, 1951  DATE OF ADMISSION:  11/12/2014 DATE OF DISCHARGE:  11/13/2014  PRIMARY CARE PHYSICIAN:  Laurian Brim, MD    DISCHARGE DIAGNOSES:   1.  Uncontrolled severe epistaxis anemia due to acute blood loss.  2.  Chronic obstructive pulmonary disease. 3.  Tobacco abuse.   CONDITION: Stable.   CODE STATUS: Full code.   MEDICATIONS:  Xanax 0.5 mg p.o. t.i.d., Keflex 500 mg p.o. t.i.d. for 7 days.   DIET: Regular diet.   ACTIVITY: As tolerated.   FOLLOW-UP CARE: Follow up Dr. Kathyrn Sheriff this Friday.  Follow up with PCP within 1 to 2 weeks. The patient also needed saline to bilateral nostrils 3 to 4 times a day.    REASON FOR ADMISSION: Nose bleeding for 2 days.   HOSPITAL COURSE: The patient is a 64 year old Caucasian female with a history of chronic obstructive pulmonary disease and anxiety, was sent to ED due to nose bleeding for 2 days. For detailed history and physical examination, please refer to the admission note dictated by me. The patient's laboratory data on admission date showed WBC 11.5, hemoglobin was 8, then decreased to 6.8 last night.  The patient's BUN 23, creatinine 0.97. Electrolytes were normal. The patient has a lot of nose bleeding last night.  The patient was packed with gauze initially.  Dr. Kathyrn Sheriff packed the patient's nostril bilaterally but the patient still had active bleeding, so Dr. Kathyrn Sheriff did endoscopy control for nasal epistaxis.  After the procedure, the patient has no active bleeding.  The patient has anemia due to acute blood loss, due to epistaxis.  She was given 2 units of PRBC transfusion.  Hemoglobin is 8.1 today.  Since the patient has in no active bleeding, Dr. Kathyrn Sheriff, ENT physician suggested that the patient may be discharged home today and start Keflex 5/500 mg p.o. t.i.d. for 7 days. For preventing sinus infection. In addition, he suggested saline flush 3 to 4 times a day to dissolve  gel in the nose.  The patient has no complaints.  Her vital signs are stable. She is clinically stable and will be discharged home today.  I discussed the patient's discharge plan with the patient, nurse, case manager, and Dr. Kathyrn Sheriff.  TIME SPENT: About 42 minutes.     ____________________________ Demetrios Loll, MD qc:DT D: 11/13/2014 17:06:10 ET T: 11/13/2014 17:38:32 ET JOB#: [id number]  cc: Demetrios Loll, MD, <Dictator> Demetrios Loll MD ELECTRONICALLY SIGNED 11/14/2014 17:22

## 2017-12-25 ENCOUNTER — Ambulatory Visit (INDEPENDENT_AMBULATORY_CARE_PROVIDER_SITE_OTHER): Payer: PPO | Admitting: Family Medicine

## 2017-12-25 ENCOUNTER — Encounter: Payer: Self-pay | Admitting: Family Medicine

## 2017-12-25 VITALS — BP 149/86 | HR 91 | Temp 97.0°F | Ht 65.3 in | Wt 119.5 lb

## 2017-12-25 DIAGNOSIS — G4700 Insomnia, unspecified: Secondary | ICD-10-CM

## 2017-12-25 DIAGNOSIS — Z7689 Persons encountering health services in other specified circumstances: Secondary | ICD-10-CM

## 2017-12-25 DIAGNOSIS — F331 Major depressive disorder, recurrent, moderate: Secondary | ICD-10-CM | POA: Diagnosis not present

## 2017-12-25 DIAGNOSIS — L821 Other seborrheic keratosis: Secondary | ICD-10-CM | POA: Diagnosis not present

## 2017-12-25 DIAGNOSIS — F419 Anxiety disorder, unspecified: Secondary | ICD-10-CM

## 2017-12-25 DIAGNOSIS — Z720 Tobacco use: Secondary | ICD-10-CM

## 2017-12-25 DIAGNOSIS — J439 Emphysema, unspecified: Secondary | ICD-10-CM | POA: Diagnosis not present

## 2017-12-25 MED ORDER — VARENICLINE TARTRATE 0.5 MG X 11 & 1 MG X 42 PO MISC: ORAL | 0 refills | Status: DC

## 2017-12-25 MED ORDER — AMITRIPTYLINE HCL 50 MG PO TABS: 50.0000 mg | ORAL_TABLET | Freq: Two times a day (BID) | ORAL | 3 refills | Status: DC

## 2017-12-25 NOTE — Progress Notes (Signed)
BP (!) 149/86 (BP Location: Left Arm, Patient Position: Sitting, Cuff Size: Normal)   Pulse 91   Temp (!) 97 F (36.1 C) (Oral)   Ht 5' 5.3" (1.659 m)   Wt 119 lb 8 oz (54.2 kg)   SpO2 98%   BMI 19.70 kg/m    Subjective:    Patient ID: Jordan Cantu, female    DOB: 22-Sep-1950, 67 y.o.   MRN: [id number]  HPI: Jordan Cantu is a 67 y.o. female  Chief Complaint  Patient presents with  . Establish Care    Have not been to doctor in 2.5 years  . Medication Refill    Depression/anxiety follow up  . Referral    Dermatology for spot in center of back.   . Smoking Cessation    Patient would like to start Chantix   Pt here today to establish care.   Wanting to stop smoking. Has chronic bronchitis and chronic cough, was on inhalers and some other medicines - will bring records at next visit. Has been on chantix before with good results and wants to get back on. Did very well, no vivid dreams or SI previously on it.   Previously on elavil 100 mg at bedtime, wanting to go down to 50 mg BID so she can adjust the medicine easier for nights when she doesn't need the whole dose. This did well for her sleep and depression. On xanax 0.5 mg BID for her anxiety, is almost out of that and finds it really helpful for her generalized anxiety.   Has several skin lesions on her back that she would like a Dermatology referral for. They are changing color and shape, itch frequently. Has not been putting anything on them.   Otherwise, no known medical issues. Has not had a CPE for several years.   Relevant past medical, surgical, family and social history reviewed and updated as indicated. Interim medical history since our last visit reviewed. Allergies and medications reviewed and updated.  Review of Systems  Per HPI unless specifically indicated above     Objective:    BP (!) 149/86 (BP Location: Left Arm, Patient Position: Sitting, Cuff Size: Normal)   Pulse 91   Temp (!)  97 F (36.1 C) (Oral)   Ht 5' 5.3" (1.659 m)   Wt 119 lb 8 oz (54.2 kg)   SpO2 98%   BMI 19.70 kg/m   Wt Readings from Last 3 Encounters:  12/25/17 119 lb 8 oz (54.2 kg)    Physical Exam  Constitutional: She is oriented to person, place, and time. She appears well-developed and well-nourished. No distress.  HENT:  Head: Atraumatic.  Eyes: Pupils are equal, round, and reactive to light. Conjunctivae are normal.  Neck: Normal range of motion. Neck supple.  Cardiovascular: Normal rate and regular rhythm.  Pulmonary/Chest: Effort normal and breath sounds normal.  Musculoskeletal: Normal range of motion.  Neurological: She is alert and oriented to person, place, and time.  Skin: Skin is warm and dry.  Psychiatric: Judgment and thought content normal.  Pt appears nervous, jittery  Nursing note and vitals reviewed.       Assessment & Plan:   Problem List Items Addressed This Visit      Respiratory   Emphysema, unspecified ([place])    Will get previous regimen from her records and restart medications. No recent flares, doing well right now.       Relevant Medications   varenicline (CHANTIX STARTING MONTH PAK) 0.5  MG X 11 & 1 MG X 42 tablet     Other   Anxiety    Has done well on prn xanax in the past. Pt aware we do not prescribe controlled substances on first visit and is willing to wait until her next appt for any refills here. States she only has a few left but will make them last. Will review controlled substance contract at next visit      Relevant Medications   ALPRAZolam (XANAX) 0.5 MG tablet   amitriptyline (ELAVIL) 50 MG tablet   Depression    Stable on elavil, continue current regimen      Relevant Medications   ALPRAZolam (XANAX) 0.5 MG tablet   amitriptyline (ELAVIL) 50 MG tablet   Insomnia    Stable on elavil, continue current regimen. Reduced dose so she is able to self adjust as needed      Tobacco use    Pt ready to quit, has had success x 1 year in  the past with chantix, wants to try again. Risks and benefits reviewed, she is hoping to be done for good. Congratulated efforts       Other Visit Diagnoses    Seborrheic keratoses    -  Primary   Reassured pt of benigin nature and gave options of shave excision vs cryotherapy. Pt opting to leave them alone as long as they aren't cancerous   Encounter to establish care           Follow up plan: Return for CPE, AWV with Tiffany.

## 2017-12-28 ENCOUNTER — Encounter: Payer: Self-pay | Admitting: Family Medicine

## 2017-12-28 ENCOUNTER — Ambulatory Visit (INDEPENDENT_AMBULATORY_CARE_PROVIDER_SITE_OTHER): Payer: PPO

## 2017-12-28 ENCOUNTER — Ambulatory Visit (INDEPENDENT_AMBULATORY_CARE_PROVIDER_SITE_OTHER): Payer: PPO | Admitting: Family Medicine

## 2017-12-28 VITALS — BP 118/78 | HR 94 | Temp 97.0°F | Resp 17 | Ht 63.0 in | Wt 116.1 lb

## 2017-12-28 VITALS — BP 139/90 | HR 101 | Temp 97.0°F | Ht 65.3 in | Wt 116.1 lb

## 2017-12-28 DIAGNOSIS — Z0001 Encounter for general adult medical examination with abnormal findings: Secondary | ICD-10-CM

## 2017-12-28 DIAGNOSIS — F32A Depression, unspecified: Secondary | ICD-10-CM | POA: Insufficient documentation

## 2017-12-28 DIAGNOSIS — J439 Emphysema, unspecified: Secondary | ICD-10-CM | POA: Insufficient documentation

## 2017-12-28 DIAGNOSIS — G4700 Insomnia, unspecified: Secondary | ICD-10-CM | POA: Diagnosis not present

## 2017-12-28 DIAGNOSIS — Z1211 Encounter for screening for malignant neoplasm of colon: Secondary | ICD-10-CM

## 2017-12-28 DIAGNOSIS — Z720 Tobacco use: Secondary | ICD-10-CM | POA: Diagnosis not present

## 2017-12-28 DIAGNOSIS — Z1159 Encounter for screening for other viral diseases: Secondary | ICD-10-CM

## 2017-12-28 DIAGNOSIS — F331 Major depressive disorder, recurrent, moderate: Secondary | ICD-10-CM

## 2017-12-28 DIAGNOSIS — Z1231 Encounter for screening mammogram for malignant neoplasm of breast: Secondary | ICD-10-CM | POA: Diagnosis not present

## 2017-12-28 DIAGNOSIS — Z1239 Encounter for other screening for malignant neoplasm of breast: Secondary | ICD-10-CM

## 2017-12-28 DIAGNOSIS — F419 Anxiety disorder, unspecified: Secondary | ICD-10-CM | POA: Diagnosis not present

## 2017-12-28 DIAGNOSIS — Z0000 Encounter for general adult medical examination without abnormal findings: Secondary | ICD-10-CM

## 2017-12-28 DIAGNOSIS — Z2821 Immunization not carried out because of patient refusal: Secondary | ICD-10-CM | POA: Diagnosis not present

## 2017-12-28 DIAGNOSIS — F329 Major depressive disorder, single episode, unspecified: Secondary | ICD-10-CM | POA: Insufficient documentation

## 2017-12-28 DIAGNOSIS — Z13820 Encounter for screening for osteoporosis: Secondary | ICD-10-CM | POA: Diagnosis not present

## 2017-12-28 HISTORY — DX: Tobacco use: Z72.0

## 2017-12-28 MED ORDER — ALPRAZOLAM 0.5 MG PO TABS: 0.5000 mg | ORAL_TABLET | Freq: Three times a day (TID) | ORAL | 0 refills | Status: DC | PRN

## 2017-12-28 MED ORDER — ALBUTEROL SULFATE HFA 108 (90 BASE) MCG/ACT IN AERS: 2.0000 | INHALATION_SPRAY | Freq: Four times a day (QID) | RESPIRATORY_TRACT | 11 refills | Status: DC | PRN

## 2017-12-28 MED ORDER — BUPROPION HCL ER (SR) 150 MG PO TB12: 150.0000 mg | ORAL_TABLET | Freq: Every day | ORAL | 3 refills | Status: DC

## 2017-12-28 NOTE — Patient Instructions (Addendum)
Jordan Cantu , Thank you for taking time to come for your Medicare Wellness Visit. I appreciate your ongoing commitment to your health goals. Please review the following plan we discussed and let me know if I can assist you in the future.   Screening recommendations/referrals: Colonoscopy: referral made, someone will call you to schedule this. Mammogram: Please call [phone] to schedule your mammogram.  Bone Density: Please call [phone] to schedule  Recommended yearly ophthalmology/optometry visit for glaucoma screening and checkup Recommended yearly dental visit for hygiene and checkup  Vaccinations: Influenza vaccine: due 05/2018 Pneumococcal vaccine: declined Tdap vaccine: due, check with your insurance company for coverage  Shingles vaccine: eligible, check with your insurance company for coverage   Advanced directives: Advance directive discussed with you today. I have provided a copy for you to complete at home and have notarized. Once this is complete please bring a copy in to our office so we can scan it into your chart.  Conditions/risks identified: smoking cessation discussed, [name], RN will call you to discuss lung cancer screening.   Next appointment: Follow up in one year for your annual wellness exam.    Preventive Care 65 Years and Older, Female Preventive care refers to lifestyle choices and visits with your health care provider that can promote health and wellness. What does preventive care include?  A yearly physical exam. This is also called an annual well check.  Dental exams once or twice a year.  Routine eye exams. Ask your health care provider how often you should have your eyes checked.  Personal lifestyle choices, including:  Daily care of your teeth and gums.  Regular physical activity.  Eating a healthy diet.  Avoiding tobacco and drug use.  Limiting alcohol use.  Practicing safe sex.  Taking low-dose aspirin every  day.  Taking vitamin and mineral supplements as recommended by your health care provider. What happens during an annual well check? The services and screenings done by your health care provider during your annual well check will depend on your age, overall health, lifestyle risk factors, and family history of disease. Counseling  Your health care provider may ask you questions about your:  Alcohol use.  Tobacco use.  Drug use.  Emotional well-being.  Home and relationship well-being.  Sexual activity.  Eating habits.  History of falls.  Memory and ability to understand (cognition).  Work and work Statistician.  Reproductive health. Screening  You may have the following tests or measurements:  Height, weight, and BMI.  Blood pressure.  Lipid and cholesterol levels. These may be checked every 5 years, or more frequently if you are over 83 years old.  Skin check.  Lung cancer screening. You may have this screening every year starting at age 82 if you have a 30-pack-year history of smoking and currently smoke or have quit within the past 15 years.  Fecal occult blood test (FOBT) of the stool. You may have this test every year starting at age 36.  Flexible sigmoidoscopy or colonoscopy. You may have a sigmoidoscopy every 5 years or a colonoscopy every 10 years starting at age 76.  Hepatitis C blood test.  Hepatitis B blood test.  Sexually transmitted disease (STD) testing.  Diabetes screening. This is done by checking your blood sugar (glucose) after you have not eaten for a while (fasting). You may have this done every 1-3 years.  Bone density scan. This is done to screen for osteoporosis. You may have this done starting at age 68.  Mammogram. This may be done every 1-2 years. Talk to your health care provider about how often you should have regular mammograms. Talk with your health care provider about your test results, treatment options, and if necessary, the need  for more tests. Vaccines  Your health care provider may recommend certain vaccines, such as:  Influenza vaccine. This is recommended every year.  Tetanus, diphtheria, and acellular pertussis (Tdap, Td) vaccine. You may need a Td booster every 10 years.  Zoster vaccine. You may need this after age 26.  Pneumococcal 13-valent conjugate (PCV13) vaccine. One dose is recommended after age 28.  Pneumococcal polysaccharide (PPSV23) vaccine. One dose is recommended after age 72. Talk to your health care provider about which screenings and vaccines you need and how often you need them. This information is not intended to replace advice given to you by your health care provider. Make sure you discuss any questions you have with your health care provider. Document Released: 09/14/2015 Document Revised: 05/07/2016 Document Reviewed: 06/19/2015 Elsevier Interactive Patient Education  2017 [employer] Prevention in the Home Falls can cause injuries. They can happen to people of all ages. There are many things you can do to make your home safe and to help prevent falls. What can I do on the outside of my home?  Regularly fix the edges of walkways and driveways and fix any cracks.  Remove anything that might make you trip as you walk through a door, such as a raised step or threshold.  Trim any bushes or trees on the path to your home.  Use bright outdoor lighting.  Clear any walking paths of anything that might make someone trip, such as rocks or tools.  Regularly check to see if handrails are loose or broken. Make sure that both sides of any steps have handrails.  Any raised decks and porches should have guardrails on the edges.  Have any leaves, snow, or ice cleared regularly.  Use sand or salt on walking paths during winter.  Clean up any spills in your garage right away. This includes oil or grease spills. What can I do in the bathroom?  Use night lights.  Install grab bars  by the toilet and in the tub and shower. Do not use towel bars as grab bars.  Use non-skid mats or decals in the tub or shower.  If you need to sit down in the shower, use a plastic, non-slip stool.  Keep the floor dry. Clean up any water that spills on the floor as soon as it happens.  Remove soap buildup in the tub or shower regularly.  Attach bath mats securely with double-sided non-slip rug tape.  Do not have throw rugs and other things on the floor that can make you trip. What can I do in the bedroom?  Use night lights.  Make sure that you have a light by your bed that is easy to reach.  Do not use any sheets or blankets that are too big for your bed. They should not hang down onto the floor.  Have a firm chair that has side arms. You can use this for support while you get dressed.  Do not have throw rugs and other things on the floor that can make you trip. What can I do in the kitchen?  Clean up any spills right away.  Avoid walking on wet floors.  Keep items that you use a lot in easy-to-reach places.  If you need to reach  something above you, use a strong step stool that has a grab bar.  Keep electrical cords out of the way.  Do not use floor polish or wax that makes floors slippery. If you must use wax, use non-skid floor wax.  Do not have throw rugs and other things on the floor that can make you trip. What can I do with my stairs?  Do not leave any items on the stairs.  Make sure that there are handrails on both sides of the stairs and use them. Fix handrails that are broken or loose. Make sure that handrails are as long as the stairways.  Check any carpeting to make sure that it is firmly attached to the stairs. Fix any carpet that is loose or worn.  Avoid having throw rugs at the top or bottom of the stairs. If you do have throw rugs, attach them to the floor with carpet tape.  Make sure that you have a light switch at the top of the stairs and the  bottom of the stairs. If you do not have them, ask someone to add them for you. What else can I do to help prevent falls?  Wear shoes that:  Do not have high heels.  Have rubber bottoms.  Are comfortable and fit you well.  Are closed at the toe. Do not wear sandals.  If you use a stepladder:  Make sure that it is fully opened. Do not climb a closed stepladder.  Make sure that both sides of the stepladder are locked into place.  Ask someone to hold it for you, if possible.  Clearly mark and make sure that you can see:  Any grab bars or handrails.  First and last steps.  Where the edge of each step is.  Use tools that help you move around (mobility aids) if they are needed. These include:  Canes.  Walkers.  Scooters.  Crutches.  Turn on the lights when you go into a dark area. Replace any light bulbs as soon as they burn out.  Set up your furniture so you have a clear path. Avoid moving your furniture around.  If any of your floors are uneven, fix them.  If there are any pets around you, be aware of where they are.  Review your medicines with your doctor. Some medicines can make you feel dizzy. This can increase your chance of falling. Ask your doctor what other things that you can do to help prevent falls. This information is not intended to replace advice given to you by your health care provider. Make sure you discuss any questions you have with your health care provider. Document Released: 06/14/2009 Document Revised: 01/24/2016 Document Reviewed: 09/22/2014 Elsevier Interactive Patient Education  2017 [employer].

## 2017-12-28 NOTE — Assessment & Plan Note (Signed)
Stable on elavil, continue current regimen. Reduced dose so she is able to self adjust as needed

## 2017-12-28 NOTE — Assessment & Plan Note (Signed)
Has done well on prn xanax in the past. Pt aware we do not prescribe controlled substances on first visit and is willing to wait until her next appt for any refills here. States she only has a few left but will make them last. Will review controlled substance contract at next visit

## 2017-12-28 NOTE — Patient Instructions (Signed)
Follow up for CPE and AWV

## 2017-12-28 NOTE — Assessment & Plan Note (Signed)
Pt ready to quit, has had success x 1 year in the past with chantix, wants to try again. Risks and benefits reviewed, she is hoping to be done for good. Congratulated efforts

## 2017-12-28 NOTE — Assessment & Plan Note (Signed)
Will get previous regimen from her records and restart medications. No recent flares, doing well right now.

## 2017-12-28 NOTE — Progress Notes (Signed)
Subjective:   Jordan Cantu is a 67 y.o. female who presents for an Initial Medicare Annual Wellness Visit.  Review of Systems      Cardiac Risk Factors include: advanced age (>1men, >25 women);smoking/ tobacco exposure     Objective:    Today's Vitals   12/28/17 1121  BP: 118/78  Pulse: 94  Resp: 17  Temp: (!) 97 F (36.1 C)  TempSrc: Oral  Weight: 116 lb 1.6 oz (52.7 kg)  Height: 5\' 3"  (1.6 m)   Body mass index is 20.57 kg/m.  Advanced Directives 12/28/2017  Does Patient Have a Medical Advance Directive? Yes  Does patient want to make changes to medical advance directive? Yes (MAU/Ambulatory/Procedural Areas - Information given)    Current Medications (verified) Outpatient Encounter Medications as of 12/28/2017  Medication Sig  . ALPRAZolam (XANAX) 0.5 MG tablet Take 1 tablet (0.5 mg total) by mouth 3 (three) times daily as needed for anxiety.  Marland Kitchen amitriptyline (ELAVIL) 50 MG tablet Take 1 tablet (50 mg total) by mouth 2 (two) times daily.  Marland Kitchen buPROPion (WELLBUTRIN SR) 150 MG 12 hr tablet Take 1 tablet (150 mg total) by mouth daily.   No facility-administered encounter medications on file as of 12/28/2017.     Allergies (verified) Patient has no known allergies.   History: Past Medical History:  Diagnosis Date  . Anxiety   . Depression   . Emphysema, unspecified ([place])   . Insomnia    Past Surgical History:  Procedure Laterality Date  . TOOTH EXTRACTION    . WISDOM TOOTH EXTRACTION     Family History  Problem Relation Age of Onset  . Pulmonary fibrosis Mother   . Depression Mother   . Diabetes Father   . Lung disease Father   . Coronary artery disease Maternal Grandfather   . Diabetes Maternal Grandfather   . Breast cancer Maternal Aunt   . Breast cancer Maternal Aunt   . Lung cancer Maternal Uncle   . Lung cancer Paternal Aunt    Social History   Socioeconomic History  . Marital status: Legally Separated    Spouse name: Not on file    . Number of children: Not on file  . Years of education: Not on file  . Highest education level: Not on file  Occupational History  . Not on file  Social Needs  . Financial resource strain: Somewhat hard  . Food insecurity:    Worry: Never true    Inability: Never true  . Transportation needs:    Medical: No    Non-medical: No  Tobacco Use  . Smoking status: Current Every Day Smoker    Types: Cigarettes  . Smokeless tobacco: Never Used  Substance and Sexual Activity  . Alcohol use: Yes    Comment: Very Rarely, socially  . Drug use: Never  . Sexual activity: Not Currently  Lifestyle  . Physical activity:    Days per week: 0 days    Minutes per session: 0 min  . Stress: Not at all  Relationships  . Social connections:    Talks on phone: More than three times a week    Gets together: More than three times a week    Attends religious service: Never    Active member of club or organization: No    Attends meetings of clubs or organizations: Never    Relationship status: Separated  Other Topics Concern  . Not on file  Social History Narrative  . Not on  file    Tobacco Counseling Ready to quit: Yes Counseling given: Yes   Clinical Intake:  Pre-visit preparation completed: Yes  Pain : No/denies pain     Nutritional Risks: None Diabetes: No  How often do you need to have someone help you when you read instructions, pamphlets, or other written materials from your doctor or pharmacy?: 1 - Never What is the last grade level you completed in school?: high school   Interpreter Needed?: No  Information entered by :: Tiffany Hill,LPN    Activities of Daily Living In your present state of health, do you have any difficulty performing the following activities: 12/28/2017  Hearing? N  Vision? N  Difficulty concentrating or making decisions? N  Walking or climbing stairs? N  Dressing or bathing? N  Doing errands, shopping? N  Preparing Food and eating ? N  Using  the Toilet? N  In the past six months, have you accidently leaked urine? N  Do you have problems with loss of bowel control? N  Managing your Medications? N  Managing your Finances? N  Housekeeping or managing your Housekeeping? N  Some recent data might be hidden     Immunizations and Health Maintenance  There is no immunization history on file for this patient. Health Maintenance Due  Topic Date Due  . Hepatitis C Screening  June 08, 1951  . COLONOSCOPY  07/30/2001  . MAMMOGRAM  04/20/2011  . DEXA SCAN  07/30/2016    Patient Care Team: Nyra Capes as PCP - General (Family Medicine)  Indicate any recent Medical Services you may have received from other than [facility] providers in the past year (date may be approximate).     Assessment:   This is a routine wellness examination for Jordan Cantu.  Hearing/Vision screen Vision Screening Comments: No eye doctor   Dietary issues and exercise activities discussed: Current Exercise Habits: The patient does not participate in regular exercise at present, Exercise limited by: None identified  Goals    . Quit Smoking     Smoking cessation discussed      Depression Screen PHQ 2/9 Scores 12/25/2017  PHQ - 2 Score 3  PHQ- 9 Score 17    Fall Risk Fall Risk  12/25/2017  Falls in the past year? No    Is the patient's home free of loose throw rugs in walkways, pet beds, electrical cords, etc?   yes      Grab bars in the bathroom? no      Handrails on the stairs?   yes      Adequate lighting?   yes  Timed Get Up and Go Performed Completed in 8 seconds with no use of assistive devices, steady gait. No intervention needed at this time.   Cognitive Function:     6CIT Screen 12/28/2017  What Year? 0 points  What month? 0 points  What time? 0 points  Count back from 20 0 points  Months in reverse 0 points  Repeat phrase 0 points  Total Score 0    Screening Tests Health Maintenance  Topic Date Due  . Hepatitis C  Screening  02-07-1951  . COLONOSCOPY  07/30/2001  . MAMMOGRAM  04/20/2011  . DEXA SCAN  07/30/2016  . TETANUS/TDAP  12/29/2018 (Originally 07/30/1970)  . PNA vac Low Risk Adult (1 of 2 - PCV13) 12/29/2018 (Originally 07/30/2016)  . INFLUENZA VACCINE  04/01/2018    Qualifies for Shingles Vaccine? Yes, discussed shingrix vaccine.   Cancer Screenings: Lung: Low Dose  CT Chest recommended if Age 70-80 years, 30 pack-year currently smoking OR have quit w/in 15years. Patient does qualify. Breast: Up to date on Mammogram? No  ordered Up to date of Bone Density/Dexa? No ordered Colorectal: referral sent   Additional Screenings:  Hepatitis C Screening: done today      Plan:    I have personally reviewed and addressed the Medicare Annual Wellness questionnaire and have noted the following in the patient's chart:  A. Medical and social history B. Use of alcohol, tobacco or illicit drugs  C. Current medications and supplements D. Functional ability and status E.  Nutritional status F.  Physical activity G. Advance directives H. List of other physicians I.  Hospitalizations, surgeries, and ER visits in previous 12 months J.  [place] such as hearing and vision if needed, cognitive and depression L. Referrals and appointments   In addition, I have reviewed and discussed with patient certain preventive protocols, quality metrics, and best practice recommendations. A written personalized care plan for preventive services as well as general preventive health recommendations were provided to patient.   Signed,  Tyler Aas, LPN Nurse Health Advisor   Nurse Notes:none

## 2017-12-28 NOTE — Patient Instructions (Signed)
Please call [facility] to schedule your Mammogram and Bone Density Scan.  [place], [place] [address] [phone]   Hepatitis C Test Hepatitis C is a liver infection caused by the hepatitis C virus (HCV). Hepatitis C is usually diagnosed with two blood tests. One test checks for antibodies to the virus in your blood. Antibodies are proteins that your body makes to fight infections. If you have antibodies to HCV, it means you have been infected. It does not mean you are still infected. An HCV infection may not cause any symptoms, and you may be able to get rid of the virus without treatment. If you have antibodies to HCV, you will need to have another test to find out if you are still infected. This test is called the HCV RNA qualitative test. It looks for genetic material from HCV in your blood. If you are diagnosed with an active HCV infection, you may also have an HCV RNA quantitative test to measure the amount of virus in your blood (viral load). Your health care provider may repeat this test in order to monitor you during treatment. You may also have an HCV genotype test to identify the kind (genotype) of virus you have. This helps your health care provider determine the best treatment for you. You may be tested if you show symptoms of HCV infection. It is important to be tested because hepatitis C can lead to serious liver damage if not treated. All HCV tests require a blood sample taken from a vein in your hand or arm. What do the results mean? It is your responsibility to obtain your test results. Ask the lab or department performing the test when and how you will get your results. Talk to your health care provider if you have any questions about your test results. Results of both the HCV antibody test and the HCV RNA test will be either positive or negative. Meaning of Negative Test Results  If your HCV antibody test is negative, it may mean that you have  not been infected with HCV. However, it can take a few months for the antibodies to build up in your blood. If it is possible you may have been infected recently, you may need to repeat the test.  If your HCV RNA qualitative test is negative, this means it is unlikely you have an active HCV infection. Meaning of Positive Test Results  If your HCV antibody test is positive, it is likely that you are infected or have been infected with HCV.  If your HCV RNA qualitative test is also positive, it confirms you have an active HCV infection. Talk with your health care provider to discuss your results, treatment options, and if necessary, the need for more tests. Talk with your health care provider if you have any questions about your results. This information is not intended to replace advice given to you by your health care provider. Make sure you discuss any questions you have with your health care provider. Document Released: 09/20/2004 Document Revised: 04/23/2016 Document Reviewed: 11/21/2013 Elsevier Interactive Patient Education  [employer].

## 2017-12-28 NOTE — Assessment & Plan Note (Signed)
Stable on elavil, continue current regimen

## 2017-12-28 NOTE — Progress Notes (Signed)
BP 139/90 (BP Location: Right Arm, Patient Position: Sitting, Cuff Size: Normal)   Pulse (!) 101   Temp (!) 97 F (36.1 C) (Oral) Comment: Patient just drank a cold drink.  Ht 5' 5.3" (1.659 m)   Wt 116 lb 1.6 oz (52.7 kg)   SpO2 99%   BMI 19.14 kg/m    Subjective:    Patient ID: Jordan Cantu, female    DOB: 1950/09/09, 67 y.o.   MRN: [id number]  HPI: Jordan Cantu is a 67 y.o. female presenting on 12/28/2017 for comprehensive medical examination. Current medical complaints include:see below  Out of her xanax, has been taking it for years TID prn for significant anxiety issues with good relief. Denies having any side effects with the medication.   Sleeping fairly well with elavil. Dose recently changed so she can take less on days she gets to be late so she isn't so groggy in the mornings.   Insurance would not cover chantix, needs to try zyban first. Has tried this in the past with some increased anxiety but willing to try again as it's been years. Very motivated to quit smoking.   Still hasn't located her past records to get her prior inhaler regimen. Will work on getting them. No wheezing, SOB, recent flares.   Depression Screen done today and results listed below:  Depression screen [facility] 2/9 12/25/2017  Decreased Interest 0  Down, Depressed, Hopeless 3  PHQ - 2 Score 3  Altered sleeping 3  Tired, decreased energy 3  Change in appetite 3  Feeling bad or failure about yourself  1  Trouble concentrating 1  Moving slowly or fidgety/restless 3  Suicidal thoughts 0  PHQ-9 Score 17  Difficult doing work/chores Somewhat difficult    The patient does not have a history of falls. I did not complete a risk assessment for falls. A plan of care for falls was not documented.   Past Medical History:  Past Medical History:  Diagnosis Date  . Anxiety   . Depression   . Emphysema, unspecified ([place])   . Insomnia     Surgical History:  Past Surgical History:    Procedure Laterality Date  . TOOTH EXTRACTION    . WISDOM TOOTH EXTRACTION      Medications:  Current Outpatient Medications on File Prior to Visit  Medication Sig  . amitriptyline (ELAVIL) 50 MG tablet Take 1 tablet (50 mg total) by mouth 2 (two) times daily.   No current facility-administered medications on file prior to visit.     Allergies:  No Known Allergies  Social History:  Social History   Socioeconomic History  . Marital status: Legally Separated    Spouse name: Not on file  . Number of children: Not on file  . Years of education: Not on file  . Highest education level: Not on file  Occupational History  . Not on file  Social Needs  . Financial resource strain: Somewhat hard  . Food insecurity:    Worry: Never true    Inability: Never true  . Transportation needs:    Medical: No    Non-medical: No  Tobacco Use  . Smoking status: Current Every Day Smoker    Types: Cigarettes  . Smokeless tobacco: Never Used  Substance and Sexual Activity  . Alcohol use: Yes    Comment: Very Rarely, socially  . Drug use: Never  . Sexual activity: Not Currently  Lifestyle  . Physical activity:    Days per  week: 0 days    Minutes per session: 0 min  . Stress: Not at all  Relationships  . Social connections:    Talks on phone: More than three times a week    Gets together: More than three times a week    Attends religious service: Never    Active member of club or organization: No    Attends meetings of clubs or organizations: Never    Relationship status: Separated  . Intimate partner violence:    Fear of current or ex partner: No    Emotionally abused: No    Physically abused: No    Forced sexual activity: No  Other Topics Concern  . Not on file  Social History Narrative  . Not on file   Social History   Tobacco Use  Smoking Status Current Every Day Smoker  . Types: Cigarettes  Smokeless Tobacco Never Used   Social History   Substance and Sexual  Activity  Alcohol Use Yes   Comment: Very Rarely, socially    Family History:  Family History  Problem Relation Age of Onset  . Pulmonary fibrosis Mother   . Depression Mother   . Diabetes Father   . Lung disease Father   . Coronary artery disease Maternal Grandfather   . Diabetes Maternal Grandfather   . Breast cancer Maternal Aunt   . Breast cancer Maternal Aunt   . Lung cancer Maternal Uncle   . Lung cancer Paternal Aunt     Past medical history, surgical history, medications, allergies, family history and social history reviewed with patient today and changes made to appropriate areas of the chart.   Review of Systems - General ROS: negative Psychological ROS: positive for - anxiety Ophthalmic ROS: negative ENT ROS: negative Breast ROS: negative for breast lumps Respiratory ROS: no cough, shortness of breath, or wheezing Cardiovascular ROS: no chest pain or dyspnea on exertion Gastrointestinal ROS: no abdominal pain, change in bowel habits, or black or bloody stools Genito-Urinary ROS: no dysuria, trouble voiding, or hematuria Musculoskeletal ROS: negative Neurological ROS: no TIA or stroke symptoms Dermatological ROS: negative All other ROS negative except what is listed above and in the HPI.      Objective:    BP 139/90 (BP Location: Right Arm, Patient Position: Sitting, Cuff Size: Normal)   Pulse (!) 101   Temp (!) 97 F (36.1 C) (Oral) Comment: Patient just drank a cold drink.  Ht 5' 5.3" (1.659 m)   Wt 116 lb 1.6 oz (52.7 kg)   SpO2 99%   BMI 19.14 kg/m   Wt Readings from Last 3 Encounters:  12/28/17 116 lb 1.6 oz (52.7 kg)  12/28/17 116 lb 1.6 oz (52.7 kg)  12/25/17 119 lb 8 oz (54.2 kg)    Physical Exam  Constitutional: She is oriented to person, place, and time. She appears well-developed and well-nourished. No distress.  HENT:  Head: Atraumatic.  Right Ear: External ear normal.  Left Ear: External ear normal.  Nose: Nose normal.  Mouth/Throat:  Oropharynx is clear and moist. No oropharyngeal exudate.  Eyes: Pupils are equal, round, and reactive to light. Conjunctivae are normal. No scleral icterus.  Neck: Normal range of motion. Neck supple. No thyromegaly present.  Cardiovascular: Normal rate, regular rhythm, normal heart sounds and intact distal pulses.  Pulmonary/Chest: Effort normal and breath sounds normal. No respiratory distress. Right breast exhibits no mass, no skin change and no tenderness. Left breast exhibits no mass, no skin change and no tenderness.  Abdominal: Soft. Bowel sounds are normal. She exhibits no mass. There is no tenderness.  Genitourinary:  Genitourinary Comments: GU exam declined by pt  Musculoskeletal: Normal range of motion. She exhibits no edema or tenderness.  Lymphadenopathy:    She has no cervical adenopathy.    She has no axillary adenopathy.  Neurological: She is alert and oriented to person, place, and time. No cranial nerve deficit.  Skin: Skin is warm and dry. No rash noted.  Psychiatric: She has a normal mood and affect. Her behavior is normal.  Nursing note and vitals reviewed.   Results for orders placed or performed in visit on 12/28/17  Hepatitis C Antibody  Result Value Ref Range   Hep C Virus Ab <0.1 0.0 - 0.9 s/co ratio      Assessment & Plan:   Problem List Items Addressed This Visit      Respiratory   Emphysema, unspecified ([place])    Pt agreeable to restarting albuterol today, wanting to wait for her records to see what she used to be on before starting other inhalers. Encouraged continued efforts at smoking cessation      Relevant Medications   albuterol (PROVENTIL HFA;VENTOLIN HFA) 108 (90 Base) MCG/ACT inhaler     Other   Anxiety    Controlled substance agreement reviewed and signed today. [place] controlled substance database reviewed. Will restart her xanax regimen and continue to monitor.       Relevant Medications   buPROPion (WELLBUTRIN SR) 150 MG 12 hr tablet    ALPRAZolam (XANAX) 0.5 MG tablet   Depression    Continue elavil for depression and insomnia. Pt states things are under fairly good control and most of her issues are coming from being out of her anxiety medicine right now      Relevant Medications   buPROPion (WELLBUTRIN SR) 150 MG 12 hr tablet   ALPRAZolam (XANAX) 0.5 MG tablet   Insomnia - Primary    Well controlled, continue current regimen      Tobacco use    Patches and gums have not worked, patches left a rash. Has been anxious on wellbutrin in the past but willing to try it again as chantix isn't covered for her. Precautions reviewed, pt will follow up if having issues again with it.        Other Visit Diagnoses    Annual physical exam       Need for hepatitis C screening test       Relevant Orders   Hepatitis C Antibody (Completed)   Refused pneumococcal vaccination       Screening for breast cancer       Relevant Orders   MM DIGITAL SCREENING BILATERAL   Screening for colon cancer       Relevant Orders   Ambulatory referral to Gastroenterology   Screening for osteoporosis       Relevant Orders   DG BONE DENSITY (DXA)       Follow up plan: Return in about 3 months (around 03/29/2018) for Anxiety f/u.   LABORATORY TESTING:  - Pap smear: not applicable  IMMUNIZATIONS:   - Tdap: Tetanus vaccination status reviewed: declined. - Influenza: Postponed to flu season - Pneumovax: postponed - Prevnar: postponed  SCREENING: -Mammogram: Ordered today  - Colonoscopy: ordered today  - Bone Density: Ordered today  PATIENT COUNSELING:   Advised to take 1 mg of folate supplement per day if capable of pregnancy.   Sexuality: Discussed sexually transmitted diseases, partner selection, use  of condoms, avoidance of unintended pregnancy  and contraceptive alternatives.   Advised to avoid cigarette smoking.  I discussed with the patient that most people either abstain from alcohol or drink within safe limits (<=14/week  and <=4 drinks/occasion for males, <=7/weeks and <= 3 drinks/occasion for females) and that the risk for alcohol disorders and other health effects rises proportionally with the number of drinks per week and how often a drinker exceeds daily limits.  Discussed cessation/primary prevention of drug use and availability of treatment for abuse.   Diet: Encouraged to adjust caloric intake to maintain  or achieve ideal body weight, to reduce intake of dietary saturated fat and total fat, to limit sodium intake by avoiding high sodium foods and not adding table salt, and to maintain adequate dietary potassium and calcium preferably from fresh fruits, vegetables, and low-fat dairy products.    stressed the importance of regular exercise  Injury prevention: Discussed safety belts, safety helmets, smoke detector, smoking near bedding or upholstery.   Dental health: Discussed importance of regular tooth brushing, flossing, and dental visits.    NEXT PREVENTATIVE PHYSICAL DUE IN 1 YEAR. Return in about 3 months (around 03/29/2018) for Anxiety f/u.

## 2017-12-29 ENCOUNTER — Encounter: Payer: Self-pay | Admitting: Family Medicine

## 2017-12-29 ENCOUNTER — Telehealth: Payer: Self-pay | Admitting: *Deleted

## 2017-12-29 LAB — HEPATITIS C ANTIBODY: Hep C Virus Ab: <0.1 s/co ratio (ref 0.0–0.9)

## 2017-12-29 NOTE — Telephone Encounter (Signed)
Received referral for low dose lung cancer screening CT scan. Message left at phone number listed in EMR for patient to call me back to facilitate scheduling scan.  

## 2017-12-31 NOTE — Assessment & Plan Note (Signed)
Well-controlled, continue current regimen 

## 2017-12-31 NOTE — Assessment & Plan Note (Signed)
Patches and gums have not worked, patches left a rash. Has been anxious on wellbutrin in the past but willing to try it again as chantix isn't covered for her. Precautions reviewed, pt will follow up if having issues again with it.

## 2017-12-31 NOTE — Assessment & Plan Note (Signed)
Continue elavil for depression and insomnia. Pt states things are under fairly good control and most of her issues are coming from being out of her anxiety medicine right now

## 2017-12-31 NOTE — Assessment & Plan Note (Signed)
Pt agreeable to restarting albuterol today, wanting to wait for her records to see what she used to be on before starting other inhalers. Encouraged continued efforts at smoking cessation

## 2017-12-31 NOTE — Assessment & Plan Note (Signed)
Controlled substance agreement reviewed and signed today. [place] controlled substance database reviewed. Will restart her xanax regimen and continue to monitor.

## 2018-01-20 ENCOUNTER — Telehealth: Payer: Self-pay | Admitting: *Deleted

## 2018-01-20 NOTE — Telephone Encounter (Signed)
Received referral for low dose lung cancer screening CT scan. Message left at phone number listed in EMR for patient to call me back to facilitate scheduling scan.  

## 2018-02-03 ENCOUNTER — Telehealth: Payer: Self-pay | Admitting: *Deleted

## 2018-02-03 ENCOUNTER — Other Ambulatory Visit: Payer: Self-pay

## 2018-02-03 DIAGNOSIS — Z87891 Personal history of nicotine dependence: Secondary | ICD-10-CM

## 2018-02-03 DIAGNOSIS — Z122 Encounter for screening for malignant neoplasm of respiratory organs: Secondary | ICD-10-CM

## 2018-02-03 DIAGNOSIS — Z1211 Encounter for screening for malignant neoplasm of colon: Secondary | ICD-10-CM

## 2018-02-03 NOTE — Telephone Encounter (Signed)
Received referral for initial lung cancer screening scan. Contacted patient and obtained smoking history,(current, 50 pack year) as well as answering questions related to screening process. Patient denies signs of lung cancer such as weight loss or hemoptysis. Patient denies comorbidity that would prevent curative treatment if lung cancer were found. Patient is scheduled for shared decision making visit and CT scan on 02/22/18.

## 2018-02-04 ENCOUNTER — Other Ambulatory Visit: Payer: Self-pay | Admitting: Family Medicine

## 2018-02-04 ENCOUNTER — Telehealth: Payer: Self-pay

## 2018-02-04 MED ORDER — ALPRAZOLAM 0.5 MG PO TABS: 0.5000 mg | ORAL_TABLET | Freq: Three times a day (TID) | ORAL | 0 refills | Status: DC | PRN

## 2018-02-04 NOTE — Telephone Encounter (Signed)
Patient called stating she got a letter from [employer] informing her she is eligible for a low income prescription plan for her medications to be cheaper. States she brought the letter to her pharmacy and they informed her it needed to be approved by her PCP office. Informed pt she could bring the letter by so that one of the providers can look It over but the pharmacy should also contact the office as well. She informed me the pharmacist did say she would call the office with this information.  Patient also inquired about xanax refill she called in the morning. Informed her it took 24-48 hours for refill requests. Pt verbalized understanding.

## 2018-02-04 NOTE — Telephone Encounter (Signed)
Xanax refill Last Refill:12/28/17 # 90 Last OV: 12/28/17 PCP: Wynona Dove Pharmacy:[facility] - [place],[address]

## 2018-02-04 NOTE — Telephone Encounter (Signed)
Copied from [place] [id number]. Topic: Inquiry >> Feb 04, 2018 11:18 AM Jordan Cantu wrote: Reason for CRM: Patient states she wants Jordan Cantu Archivist ) to call her back regarding the medication assistance program. She said they have talked about this before Call back @ [phone]   See other phone encounter- th

## 2018-02-04 NOTE — Telephone Encounter (Signed)
Copied from [place] [id number]. Topic: Quick Communication - Rx Refill/Question >> Feb 04, 2018 11:16 AM Ahmed Prima L wrote: Medication: ALPRAZolam Duanne Moron) 0.5 MG tablet   Has the patient contacted their pharmacy? Yes, they said It was denied because it was not on her medication list but she said it is.  (Agent: If no, request that the patient contact the pharmacy for the refill.) (Agent: If yes, when and what did the pharmacy advise?)  Preferred Pharmacy (with phone number or street name): [facility] - [place], [address] AT [place]: Please be advised that RX refills may take up to 3 business days. We ask that you follow-up with your pharmacy.

## 2018-02-18 ENCOUNTER — Telehealth: Payer: Self-pay | Admitting: Nurse Practitioner

## 2018-02-22 ENCOUNTER — Inpatient Hospital Stay: Payer: PPO | Attending: Nurse Practitioner | Admitting: Nurse Practitioner

## 2018-02-22 ENCOUNTER — Encounter: Payer: Self-pay | Admitting: Nurse Practitioner

## 2018-02-22 ENCOUNTER — Ambulatory Visit
Admission: RE | Admit: 2018-02-22 | Discharge: 2018-02-22 | Disposition: A | Discharge status: Home / Self Care | Payer: PPO | Source: Ambulatory Visit | Attending: Nurse Practitioner | Admitting: Nurse Practitioner

## 2018-02-22 DIAGNOSIS — I700 Atherosclerosis of aorta: Secondary | ICD-10-CM | POA: Insufficient documentation

## 2018-02-22 DIAGNOSIS — R918 Other nonspecific abnormal finding of lung field: Secondary | ICD-10-CM | POA: Insufficient documentation

## 2018-02-22 DIAGNOSIS — I2510 Atherosclerotic heart disease of native coronary artery without angina pectoris: Secondary | ICD-10-CM | POA: Diagnosis not present

## 2018-02-22 DIAGNOSIS — Z87891 Personal history of nicotine dependence: Secondary | ICD-10-CM

## 2018-02-22 DIAGNOSIS — Z122 Encounter for screening for malignant neoplasm of respiratory organs: Secondary | ICD-10-CM | POA: Diagnosis not present

## 2018-02-22 DIAGNOSIS — F17210 Nicotine dependence, cigarettes, uncomplicated: Secondary | ICD-10-CM | POA: Diagnosis not present

## 2018-02-22 DIAGNOSIS — J439 Emphysema, unspecified: Secondary | ICD-10-CM | POA: Insufficient documentation

## 2018-02-22 NOTE — Progress Notes (Signed)
In accordance with CMS guidelines, patient has met eligibility criteria including age, absence of signs or symptoms of lung cancer.  Social History   Tobacco Use  . Smoking status: Current Every Day Smoker    Packs/day: 1.00    Years: 50.00    Pack years: 50.00    Types: Cigarettes  . Smokeless tobacco: Never Used  Substance Use Topics  . Alcohol use: Yes    Comment: Very Rarely, socially  . Drug use: Never      A shared decision-making session was conducted prior to the performance of CT scan. This includes one or more decision aids, includes benefits and harms of screening, follow-up diagnostic testing, over-diagnosis, false positive rate, and total radiation exposure.   Counseling on the importance of adherence to annual lung cancer LDCT screening, impact of co-morbidities, and ability or willingness to undergo diagnosis and treatment is imperative for compliance of the program.   Counseling on the importance of continued smoking cessation for former smokers; the importance of smoking cessation for current smokers, and information about tobacco cessation interventions have been given to patient including [place] and 1800 quit [place] programs.   Written order for lung cancer screening with LDCT has been given to the patient and any and all questions have been answered to the best of my abilities.    Yearly follow up will be coordinated by [name], Thoracic Navigator.  Beckey Rutter, DNP, AGNP-C [place] [facility] [phone] (work cell) [phone] (office) 02/22/18 10:38 AM

## 2018-03-01 ENCOUNTER — Telehealth: Payer: Self-pay | Admitting: *Deleted

## 2018-03-01 NOTE — Telephone Encounter (Signed)
Notified patient of LDCT lung cancer screening program results with recommendation for 6 month follow up imaging. Also notified of incidental findings noted below and is encouraged to discuss further with PCP who will receive a copy of this note and/or the CT report. Patient verbalizes understanding.    IMPRESSION: 1. Lung-RADS 3, probably benign findings. Short-term follow-up in 6 months is recommended with repeat low-dose chest CT without contrast (please use the following order, "CT CHEST LCS NODULE FOLLOW-UP W/O CM"). Multiple bilateral pulmonary nodules, many of which are similar to 2008. A left lower lobe pulmonary nodule is either new or enlarged since that exam and warrants special attention. 2. Aortic atherosclerosis (ICD10-I70.0), coronary artery atherosclerosis and emphysema (ICD10-J43.9).

## 2018-03-03 ENCOUNTER — Encounter: Payer: Self-pay | Admitting: *Deleted

## 2018-03-03 ENCOUNTER — Other Ambulatory Visit: Payer: Self-pay

## 2018-03-03 NOTE — Discharge Instructions (Signed)
General Anesthesia, Adult, Care After °These instructions provide you with information about caring for yourself after your procedure. Your health care provider may also give you more specific instructions. Your treatment has been planned according to current medical practices, but problems sometimes occur. Call your health care provider if you have any problems or questions after your procedure. °What can I expect after the procedure? °After the procedure, it is common to have: °· Vomiting. °· A sore throat. °· Mental slowness. ° °It is common to feel: °· Nauseous. °· Cold or shivery. °· Sleepy. °· Tired. °· Sore or achy, even in parts of your body where you did not have surgery. ° °Follow these instructions at home: °For at least 24 hours after the procedure: °· Do not: °? Participate in activities where you could fall or become injured. °? Drive. °? Use heavy machinery. °? Drink alcohol. °? Take sleeping pills or medicines that cause drowsiness. °? Make important decisions or sign legal documents. °? Take care of children on your own. °· Rest. °Eating and drinking °· If you vomit, drink water, juice, or soup when you can drink without vomiting. °· Drink enough fluid to keep your urine clear or pale yellow. °· Make sure you have little or no nausea before eating solid foods. °· Follow the diet recommended by your health care provider. °General instructions °· Have a responsible adult stay with you until you are awake and alert. °· Return to your normal activities as told by your health care provider. Ask your health care provider what activities are safe for you. °· Take over-the-counter and prescription medicines only as told by your health care provider. °· If you smoke, do not smoke without supervision. °· Keep all follow-up visits as told by your health care provider. This is important. °Contact a health care provider if: °· You continue to have nausea or vomiting at home, and medicines are not helpful. °· You  cannot drink fluids or start eating again. °· You cannot urinate after 8-12 hours. °· You develop a skin rash. °· You have fever. °· You have increasing redness at the site of your procedure. °Get help right away if: °· You have difficulty breathing. °· You have chest pain. °· You have unexpected bleeding. °· You feel that you are having a life-threatening or urgent problem. °This information is not intended to replace advice given to you by your health care provider. Make sure you discuss any questions you have with your health care provider. °Document Released: 11/24/2000 Document Revised: 01/21/2016 Document Reviewed: 08/02/2015 °Elsevier Interactive Patient Education © 2018 [employer] ° °

## 2018-03-08 ENCOUNTER — Ambulatory Visit: Payer: PPO | Admitting: Anesthesiology

## 2018-03-08 ENCOUNTER — Encounter
Admission: RE | Disposition: A | Discharge status: Home / Self Care | Payer: Self-pay | Source: Ambulatory Visit | Attending: Gastroenterology

## 2018-03-08 ENCOUNTER — Ambulatory Visit
Admission: RE | Admit: 2018-03-08 | Discharge: 2018-03-08 | Disposition: A | Discharge status: Home / Self Care | Payer: PPO | Source: Ambulatory Visit | Attending: Gastroenterology | Admitting: Gastroenterology

## 2018-03-08 DIAGNOSIS — K635 Polyp of colon: Secondary | ICD-10-CM | POA: Diagnosis not present

## 2018-03-08 DIAGNOSIS — Z1211 Encounter for screening for malignant neoplasm of colon: Secondary | ICD-10-CM

## 2018-03-08 DIAGNOSIS — K621 Rectal polyp: Secondary | ICD-10-CM | POA: Diagnosis not present

## 2018-03-08 DIAGNOSIS — F419 Anxiety disorder, unspecified: Secondary | ICD-10-CM | POA: Insufficient documentation

## 2018-03-08 DIAGNOSIS — F17210 Nicotine dependence, cigarettes, uncomplicated: Secondary | ICD-10-CM | POA: Diagnosis not present

## 2018-03-08 DIAGNOSIS — D120 Benign neoplasm of cecum: Secondary | ICD-10-CM

## 2018-03-08 DIAGNOSIS — F329 Major depressive disorder, single episode, unspecified: Secondary | ICD-10-CM | POA: Diagnosis not present

## 2018-03-08 DIAGNOSIS — J439 Emphysema, unspecified: Secondary | ICD-10-CM | POA: Diagnosis not present

## 2018-03-08 DIAGNOSIS — D123 Benign neoplasm of transverse colon: Secondary | ICD-10-CM | POA: Diagnosis not present

## 2018-03-08 DIAGNOSIS — Z79899 Other long term (current) drug therapy: Secondary | ICD-10-CM | POA: Diagnosis not present

## 2018-03-08 DIAGNOSIS — D125 Benign neoplasm of sigmoid colon: Secondary | ICD-10-CM | POA: Diagnosis not present

## 2018-03-08 HISTORY — PX: COLONOSCOPY WITH PROPOFOL: SHX5780

## 2018-03-08 HISTORY — DX: Presence of spectacles and contact lenses: Z97.3

## 2018-03-08 HISTORY — PX: POLYPECTOMY: SHX5525

## 2018-03-08 HISTORY — DX: Presence of dental prosthetic device (complete) (partial): Z97.2

## 2018-03-08 SURGERY — COLONOSCOPY WITH PROPOFOL
Anesthesia: General | Site: Rectum | Wound class: Dirty or Infected

## 2018-03-08 MED ORDER — LACTATED RINGERS IV SOLN
10.0000 mL/h | INTRAVENOUS | Status: DC
  Administered 2018-03-08: 10 mL/h via INTRAVENOUS
  Administered 2018-03-08: 11:00:00 via INTRAVENOUS

## 2018-03-08 MED ORDER — ONDANSETRON HCL 4 MG/2ML IJ SOLN
INTRAMUSCULAR | Status: DC | PRN
  Administered 2018-03-08: 4 mg via INTRAVENOUS

## 2018-03-08 MED ORDER — PROPOFOL 10 MG/ML IV BOLUS
INTRAVENOUS | Status: DC | PRN
  Administered 2018-03-08 (×3): 20 mg via INTRAVENOUS
  Administered 2018-03-08: 30 mg via INTRAVENOUS
  Administered 2018-03-08 (×3): 20 mg via INTRAVENOUS
  Administered 2018-03-08: 40 mg via INTRAVENOUS
  Administered 2018-03-08 (×3): 20 mg via INTRAVENOUS
  Administered 2018-03-08: 40 mg via INTRAVENOUS

## 2018-03-08 MED ORDER — SODIUM CHLORIDE 0.9 % IV SOLN: INTRAVENOUS | Status: DC

## 2018-03-08 MED ORDER — LIDOCAINE HCL (CARDIAC) PF 100 MG/5ML IV SOSY
PREFILLED_SYRINGE | INTRAVENOUS | Status: DC | PRN
  Administered 2018-03-08: 30 mg via INTRAVENOUS

## 2018-03-08 MED ORDER — GLYCOPYRROLATE 0.2 MG/ML IJ SOLN
INTRAMUSCULAR | Status: DC | PRN
  Administered 2018-03-08: 0.2 mg via INTRAVENOUS

## 2018-03-08 MED ORDER — STERILE WATER FOR IRRIGATION IR SOLN
Status: DC | PRN
  Administered 2018-03-08: 10:00:00

## 2018-03-08 MED ORDER — ONDANSETRON HCL 4 MG/2ML IJ SOLN: 4.0000 mg | Freq: Once | INTRAMUSCULAR | Status: DC | PRN

## 2018-03-08 SURGICAL SUPPLY — 24 items
CANISTER SUCT 1200ML W/VALVE (MISCELLANEOUS) ×3 IMPLANT
CLIP HMST 235XBRD CATH ROT (MISCELLANEOUS) IMPLANT
CLIP RESOLUTION 360 11X235 (MISCELLANEOUS)
ELECT REM PT RETURN 9FT ADLT (ELECTROSURGICAL) ×3
ELECTRODE REM PT RTRN 9FT ADLT (ELECTROSURGICAL) ×1 IMPLANT
FCP ESCP3.2XJMB 240X2.8X (MISCELLANEOUS)
FORCEPS BIOP RAD 4 LRG CAP 4 (CUTTING FORCEPS) ×3 IMPLANT
FORCEPS BIOP RJ4 240 W/NDL (MISCELLANEOUS)
FORCEPS ESCP3.2XJMB 240X2.8X (MISCELLANEOUS) IMPLANT
GOWN CVR UNV OPN BCK APRN NK (MISCELLANEOUS) ×2 IMPLANT
GOWN ISOL THUMB LOOP REG UNIV (MISCELLANEOUS) ×4
INJECTOR VARIJECT VIN23 (MISCELLANEOUS) IMPLANT
KIT DEFENDO VALVE AND CONN (KITS) IMPLANT
KIT ENDO PROCEDURE OLY (KITS) ×3 IMPLANT
MARKER SPOT ENDO TATTOO 5ML (MISCELLANEOUS) IMPLANT
PROBE APC STR FIRE (PROBE) IMPLANT
RETRIEVER NET ROTH 2.5X230 LF (MISCELLANEOUS) IMPLANT
SNARE SHORT THROW 13M SML OVAL (MISCELLANEOUS) ×3 IMPLANT
SNARE SHORT THROW 30M LRG OVAL (MISCELLANEOUS) IMPLANT
SNARE SNG USE RND 15MM (INSTRUMENTS) IMPLANT
SPOT EX ENDOSCOPIC TATTOO (MISCELLANEOUS)
TRAP ETRAP POLY (MISCELLANEOUS) ×3 IMPLANT
VARIJECT INJECTOR VIN23 (MISCELLANEOUS)
WATER STERILE IRR 250ML POUR (IV SOLUTION) ×3 IMPLANT

## 2018-03-08 NOTE — Op Note (Signed)
[facility] Gastroenterology Patient Name: Jordan Cantu Procedure Date: 03/08/2018 10:00 AM MRN: [id number] Account #: [id number] Date of Birth: Feb 20, 1951 Admit Type: Outpatient Age: 67 Room: [facility] OR ROOM 01 Gender: Female Note Status: Finalized Procedure:            Colonoscopy Indications:          Screening for colorectal malignant neoplasm Providers:            Lucilla Lame MD, MD Referring MD:         Volney American (Referring MD) Medicines:            Propofol per Anesthesia Complications:        No immediate complications. Procedure:            Pre-Anesthesia Assessment:                       - Prior to the procedure, a History and Physical was                        performed, and patient medications and allergies were                        reviewed. The patient's tolerance of previous                        anesthesia was also reviewed. The risks and benefits of                        the procedure and the sedation options and risks were                        discussed with the patient. All questions were                        answered, and informed consent was obtained. Prior                        Anticoagulants: The patient has taken no previous                        anticoagulant or antiplatelet agents. ASA Grade                        Assessment: II - A patient with mild systemic disease.                        After reviewing the risks and benefits, the patient was                        deemed in satisfactory condition to undergo the                        procedure.                       After obtaining informed consent, the colonoscope was                        passed under direct vision. Throughout the procedure,  the patient's blood pressure, pulse, and oxygen                        saturations were monitored continuously. The was                        introduced through the anus and advanced to the the                    cecum, identified by appendiceal orifice and ileocecal                        valve. The colonoscopy was performed without                        difficulty. The patient tolerated the procedure well.                        The quality of the bowel preparation was excellent. Findings:      The perianal and digital rectal examinations were normal.      Two sessile polyps were found in the cecum. The polyps were 1 to 2 mm in       size. These polyps were removed with a cold biopsy forceps. Resection       and retrieval were complete.      A 4 mm polyp was found in the transverse colon. The polyp was sessile.       The polyp was removed with a cold biopsy forceps. Resection and       retrieval were complete.      A 6 mm polyp was found in the sigmoid colon. The polyp was pedunculated.       The polyp was removed with a hot snare. Resection and retrieval were       complete.      A 2 mm polyp was found in the rectum. The polyp was sessile. The polyp       was removed with a cold biopsy forceps. Resection and retrieval were       complete. Impression:           - Two 1 to 2 mm polyps in the cecum, removed with a                        cold biopsy forceps. Resected and retrieved.                       - One 4 mm polyp in the transverse colon, removed with                        a cold biopsy forceps. Resected and retrieved.                       - One 6 mm polyp in the sigmoid colon, removed with a                        hot snare. Resected and retrieved.                       - One 2 mm polyp in the rectum, removed with a cold  biopsy forceps. Resected and retrieved. Recommendation:       - Discharge patient to home.                       - Resume previous diet.                       - Continue present medications.                       - Await pathology results.                       - Repeat colonoscopy in 3 years if polyp adenoma and 10                         years if hyperplastic Procedure Code(s):    --- Professional ---                       [phone], Colonoscopy, flexible; with removal of tumor(s),                        polyp(s), or other lesion(s) by snare technique                       [id number], 67, Colonoscopy, flexible; with biopsy, single                        or multiple Diagnosis Code(s):    --- Professional ---                       Z12.11, Encounter for screening for malignant neoplasm                        of colon                       D12.0, Benign neoplasm of cecum                       D12.3, Benign neoplasm of transverse colon (hepatic                        flexure or splenic flexure)                       D12.5, Benign neoplasm of sigmoid colon                       K62.1, Rectal polyp CPT copyright 2017 American Medical Association. All rights reserved. The codes documented in this report are preliminary and upon coder review may  be revised to meet current compliance requirements. Lucilla Lame MD, MD 03/08/2018 10:50:51 AM This report has been signed electronically. Number of Addenda: 0 Note Initiated On: 03/08/2018 10:00 AM Scope Withdrawal Time: 0 hours 10 minutes 48 seconds  Total Procedure Duration: 0 hours 30 minutes 56 seconds       [facility]

## 2018-03-08 NOTE — Anesthesia Preprocedure Evaluation (Addendum)
Anesthesia Evaluation  Patient identified by MRN, date of birth, ID band Patient awake    Reviewed: Allergy & Precautions, NPO status , Patient's Chart, lab work & pertinent test results, reviewed documented beta blocker date and time   Airway Mallampati: II  TM Distance: >3 FB Neck ROM: Full    Dental  (+) Upper Dentures   Pulmonary COPD, Current Smoker,    Pulmonary exam normal breath sounds clear to auscultation       Cardiovascular negative cardio ROS Normal cardiovascular exam Rhythm:Regular Rate:Normal     Neuro/Psych PSYCHIATRIC DISORDERS Anxiety Depression    GI/Hepatic negative GI ROS, Neg liver ROS,   Endo/Other  negative endocrine ROS  Renal/GU negative Renal ROS     Musculoskeletal negative musculoskeletal ROS (+)   Abdominal Normal abdominal exam  (+)  Abdomen: soft.    Peds  Hematology negative hematology ROS (+)   Anesthesia Other Findings   Reproductive/Obstetrics negative OB ROS                            Anesthesia Physical Anesthesia Plan  ASA: II  Anesthesia Plan: General   Post-op Pain Management:    Induction: Intravenous  PONV Risk Score and Plan:   Airway Management Planned: Natural Airway  Additional Equipment:   Intra-op Plan:   Post-operative Plan:   Informed Consent: I have reviewed the patients History and Physical, chart, labs and discussed the procedure including the risks, benefits and alternatives for the proposed anesthesia with the patient or authorized representative who has indicated his/her understanding and acceptance.     Plan Discussed with: CRNA, Anesthesiologist and Surgeon  Anesthesia Plan Comments:         Anesthesia Quick Evaluation

## 2018-03-08 NOTE — Transfer of Care (Signed)
Immediate Anesthesia Transfer of Care Note  Patient: Jordan Cantu  Procedure(s) Performed: COLONOSCOPY WITH PROPOFOL (N/A Rectum)  Patient Location: PACU  Anesthesia Type: General  Level of Consciousness: awake, alert  and patient cooperative  Airway and Oxygen Therapy: Patient Spontanous Breathing and Patient connected to supplemental oxygen  Post-op Assessment: Post-op Vital signs reviewed, Patient's Cardiovascular Status Stable, Respiratory Function Stable, Patent Airway and No signs of Nausea or vomiting  Post-op Vital Signs: Reviewed and stable  Complications: No apparent anesthesia complications

## 2018-03-08 NOTE — Anesthesia Postprocedure Evaluation (Signed)
Anesthesia Post Note  Patient: Jordan Cantu  Procedure(s) Performed: COLONOSCOPY WITH PROPOFOL (N/A Rectum) POLYPECTOMY (N/A Rectum)  Patient location during evaluation: PACU Anesthesia Type: General Level of consciousness: awake Pain management: pain level controlled Vital Signs Assessment: post-procedure vital signs reviewed and stable Respiratory status: spontaneous breathing Cardiovascular status: blood pressure returned to baseline Postop Assessment: no headache Anesthetic complications: no    Lavonna Monarch

## 2018-03-08 NOTE — H&P (Signed)
Lucilla Lame, MD [address] [place], [place] [address] Phone: [phone] Fax : [phone]  Primary Care Physician:  Volney American, [place] Primary Gastroenterologist:  Dr. Allen Norris  Pre-Procedure History & Physical: HPI:  Jordan Cantu is a 67 y.o. female is here for a screening colonoscopy.   Past Medical History:  Diagnosis Date  . Anxiety   . Depression   . Emphysema, unspecified ([place])   . Insomnia   . Wears contact lenses   . Wears dentures    full upper    Past Surgical History:  Procedure Laterality Date  . NOSE SURGERY  2016   cauterization  . TOOTH EXTRACTION    . WISDOM TOOTH EXTRACTION      Prior to Admission medications   Medication Sig Start Date End Date Taking? Authorizing Provider  albuterol (PROVENTIL HFA;VENTOLIN HFA) 108 (90 Base) MCG/ACT inhaler Inhale 2 puffs into the lungs every 6 (six) hours as needed for wheezing or shortness of breath. 12/28/17  Yes Volney American, PA-C  ALPRAZolam Duanne Moron) 0.5 MG tablet Take 1 tablet (0.5 mg total) by mouth 3 (three) times daily as needed for anxiety. 02/04/18  Yes Johnson, Megan P, DO  amitriptyline (ELAVIL) 50 MG tablet Take 1 tablet (50 mg total) by mouth 2 (two) times daily. 12/25/17  Yes Volney American, PA-C    Allergies as of 02/03/2018  . (No Known Allergies)    Family History  Problem Relation Age of Onset  . Pulmonary fibrosis Mother   . Depression Mother   . Diabetes Father   . Lung disease Father   . Coronary artery disease Maternal Grandfather   . Diabetes Maternal Grandfather   . Breast cancer Maternal Aunt   . Breast cancer Maternal Aunt   . Lung cancer Maternal Uncle   . Lung cancer Paternal Aunt     Social History   Socioeconomic History  . Marital status: Legally Separated    Spouse name: Not on file  . Number of children: Not on file  . Years of education: Not on file  . Highest education level: Not on file  Occupational History  .  Not on file  Social Needs  . Financial resource strain: Somewhat hard  . Food insecurity:    Worry: Never true    Inability: Never true  . Transportation needs:    Medical: No    Non-medical: No  Tobacco Use  . Smoking status: Current Every Day Smoker    Packs/day: 1.00    Years: 50.00    Pack years: 50.00    Types: Cigarettes  . Smokeless tobacco: Never Used  . Tobacco comment: since age 5  Substance and Sexual Activity  . Alcohol use: Yes    Alcohol/week: 1.8 oz    Types: 3 Cans of beer per week    Comment:    . Drug use: Never  . Sexual activity: Not Currently  Lifestyle  . Physical activity:    Days per week: 0 days    Minutes per session: 0 min  . Stress: Not at all  Relationships  . Social connections:    Talks on phone: More than three times a week    Gets together: More than three times a week    Attends religious service: Never    Active member of club or organization: No    Attends meetings of clubs or organizations: Never    Relationship status: Separated  . Intimate partner violence:  Fear of current or ex partner: No    Emotionally abused: No    Physically abused: No    Forced sexual activity: No  Other Topics Concern  . Not on file  Social History Narrative  . Not on file    Review of Systems: See HPI, otherwise negative ROS  Physical Exam: BP 120/73   Pulse 87   Temp (!) 97.3 F (36.3 C) (Temporal)   Resp 16   Ht 5\' 3"  (1.6 m)   Wt 115 lb (52.2 kg)   SpO2 99%   BMI 20.37 kg/m  General:   Alert,  pleasant and cooperative in NAD Head:  Normocephalic and atraumatic. Neck:  Supple; no masses or thyromegaly. Lungs:  Clear throughout to auscultation.    Heart:  Regular rate and rhythm. Abdomen:  Soft, nontender and nondistended. Normal bowel sounds, without guarding, and without rebound.   Neurologic:  Alert and  oriented x4;  grossly normal neurologically.  Impression/Plan: Jordan Cantu is now here to undergo a screening  colonoscopy.  Risks, benefits, and alternatives regarding colonoscopy have been reviewed with the patient.  Questions have been answered.  All parties agreeable.

## 2018-03-09 ENCOUNTER — Encounter: Payer: Self-pay | Admitting: Gastroenterology

## 2018-03-10 ENCOUNTER — Encounter: Payer: Self-pay | Admitting: Gastroenterology

## 2018-03-11 ENCOUNTER — Inpatient Hospital Stay
Admission: EM | Admit: 2018-03-11 | Discharge: 2018-03-15 | DRG: 394 | Disposition: A | Discharge status: Home / Self Care | Payer: PPO | Attending: General Surgery | Admitting: General Surgery

## 2018-03-11 ENCOUNTER — Encounter: Payer: Self-pay | Admitting: Emergency Medicine

## 2018-03-11 ENCOUNTER — Other Ambulatory Visit: Payer: Self-pay

## 2018-03-11 ENCOUNTER — Emergency Department: Payer: PPO

## 2018-03-11 ENCOUNTER — Telehealth: Payer: Self-pay | Admitting: Gastroenterology

## 2018-03-11 ENCOUNTER — Other Ambulatory Visit: Payer: Self-pay | Admitting: Family Medicine

## 2018-03-11 DIAGNOSIS — F17210 Nicotine dependence, cigarettes, uncomplicated: Secondary | ICD-10-CM | POA: Diagnosis not present

## 2018-03-11 DIAGNOSIS — K9189 Other postprocedural complications and disorders of digestive system: Principal | ICD-10-CM | POA: Diagnosis present

## 2018-03-11 DIAGNOSIS — Y838 Other surgical procedures as the cause of abnormal reaction of the patient, or of later complication, without mention of misadventure at the time of the procedure: Secondary | ICD-10-CM | POA: Diagnosis present

## 2018-03-11 DIAGNOSIS — K567 Ileus, unspecified: Secondary | ICD-10-CM | POA: Diagnosis not present

## 2018-03-11 DIAGNOSIS — S36039A Unspecified laceration of spleen, initial encounter: Secondary | ICD-10-CM | POA: Diagnosis not present

## 2018-03-11 DIAGNOSIS — F329 Major depressive disorder, single episode, unspecified: Secondary | ICD-10-CM | POA: Diagnosis present

## 2018-03-11 DIAGNOSIS — S36031A Moderate laceration of spleen, initial encounter: Secondary | ICD-10-CM | POA: Diagnosis not present

## 2018-03-11 DIAGNOSIS — R1084 Generalized abdominal pain: Secondary | ICD-10-CM | POA: Diagnosis not present

## 2018-03-11 DIAGNOSIS — Z79899 Other long term (current) drug therapy: Secondary | ICD-10-CM

## 2018-03-11 DIAGNOSIS — F419 Anxiety disorder, unspecified: Secondary | ICD-10-CM | POA: Diagnosis not present

## 2018-03-11 DIAGNOSIS — R140 Abdominal distension (gaseous): Secondary | ICD-10-CM

## 2018-03-11 DIAGNOSIS — S36032A Major laceration of spleen, initial encounter: Secondary | ICD-10-CM | POA: Diagnosis not present

## 2018-03-11 DIAGNOSIS — K560 Paralytic ileus: Secondary | ICD-10-CM | POA: Diagnosis not present

## 2018-03-11 DIAGNOSIS — E860 Dehydration: Secondary | ICD-10-CM | POA: Diagnosis not present

## 2018-03-11 DIAGNOSIS — Z818 Family history of other mental and behavioral disorders: Secondary | ICD-10-CM | POA: Diagnosis not present

## 2018-03-11 DIAGNOSIS — Z888 Allergy status to other drugs, medicaments and biological substances status: Secondary | ICD-10-CM

## 2018-03-11 DIAGNOSIS — R000 Tachycardia, unspecified: Secondary | ICD-10-CM | POA: Diagnosis not present

## 2018-03-11 LAB — COMPREHENSIVE METABOLIC PANEL
ALT: 12 U/L (ref 0–44)
AST: 19 U/L (ref 15–41)
Albumin: 4.2 g/dL (ref 3.5–5.0)
Alkaline Phosphatase: 87 U/L (ref 38–126)
Anion gap: 10 (ref 5–15)
BILIRUBIN TOTAL: 1.1 mg/dL (ref 0.3–1.2)
BUN: 8 mg/dL (ref 8–23)
CO2: 26 mmol/L (ref 22–32)
Calcium: 9.5 mg/dL (ref 8.9–10.3)
Chloride: 100 mmol/L (ref 98–111)
Creatinine, Ser: 0.96 mg/dL (ref 0.44–1.00)
Glucose, Bld: 114 mg/dL — ABNORMAL HIGH (ref 70–99)
POTASSIUM: 3.7 mmol/L (ref 3.5–5.1)
Sodium: 136 mmol/L (ref 135–145)
TOTAL PROTEIN: 7.8 g/dL (ref 6.5–8.1)

## 2018-03-11 LAB — APTT: APTT: 33 s (ref 24–36)

## 2018-03-11 LAB — CBC
HEMATOCRIT: 34.6 % — AB (ref 35.0–47.0)
Hemoglobin: 12.1 g/dL (ref 12.0–16.0)
MCH: 33.5 pg (ref 26.0–34.0)
MCHC: 35.1 g/dL (ref 32.0–36.0)
MCV: 95.6 fL (ref 80.0–100.0)
PLATELETS: 293 10*3/uL (ref 150–440)
RBC: 3.62 MIL/uL — AB (ref 3.80–5.20)
RDW: 13.6 % (ref 11.5–14.5)
WBC: 9.3 10*3/uL (ref 3.6–11.0)

## 2018-03-11 LAB — URINALYSIS, COMPLETE (UACMP) WITH MICROSCOPIC
BILIRUBIN URINE: NEGATIVE
Bacteria, UA: NONE SEEN
GLUCOSE, UA: NEGATIVE mg/dL
Ketones, ur: NEGATIVE mg/dL
NITRITE: NEGATIVE
Protein, ur: NEGATIVE mg/dL
Specific Gravity, Urine: 1.005 (ref 1.005–1.030)
pH: 6 (ref 5.0–8.0)

## 2018-03-11 LAB — LIPASE, BLOOD: LIPASE: 25 U/L (ref 11–51)

## 2018-03-11 LAB — PROTIME-INR
INR: 1.03
Prothrombin Time: 13.4 seconds (ref 11.4–15.2)

## 2018-03-11 LAB — TYPE AND SCREEN
ABO/RH(D): A POS
Antibody Screen: NEGATIVE

## 2018-03-11 LAB — HEMOGLOBIN AND HEMATOCRIT, BLOOD
HCT: 31.5 % — ABNORMAL LOW (ref 35.0–47.0)
Hemoglobin: 11.1 g/dL — ABNORMAL LOW (ref 12.0–16.0)

## 2018-03-11 MED ORDER — ONDANSETRON HCL 4 MG/2ML IJ SOLN: 4.0000 mg | Freq: Four times a day (QID) | INTRAMUSCULAR | Status: DC | PRN

## 2018-03-11 MED ORDER — ACETAMINOPHEN 650 MG RE SUPP: 650.0000 mg | Freq: Four times a day (QID) | RECTAL | Status: DC | PRN

## 2018-03-11 MED ORDER — IOPAMIDOL (ISOVUE-300) INJECTION 61%
15.0000 mL | INTRAVENOUS | Status: DC
  Administered 2018-03-11: 15 mL via ORAL

## 2018-03-11 MED ORDER — ALBUTEROL SULFATE (2.5 MG/3ML) 0.083% IN NEBU: 3.0000 mL | INHALATION_SOLUTION | Freq: Four times a day (QID) | RESPIRATORY_TRACT | Status: DC | PRN

## 2018-03-11 MED ORDER — AMITRIPTYLINE HCL 100 MG PO TABS
100.0000 mg | ORAL_TABLET | Freq: Every day | ORAL | Status: DC
  Administered 2018-03-11 – 2018-03-14 (×4): 100 mg via ORAL
  Filled 2018-03-11 (×6): qty 1

## 2018-03-11 MED ORDER — FAMOTIDINE IN NACL 20-0.9 MG/50ML-% IV SOLN
20.0000 mg | Freq: Two times a day (BID) | INTRAVENOUS | Status: DC
  Administered 2018-03-11 – 2018-03-15 (×8): 20 mg via INTRAVENOUS
  Filled 2018-03-11 (×8): qty 50

## 2018-03-11 MED ORDER — ACETAMINOPHEN 325 MG PO TABS: 650.0000 mg | ORAL_TABLET | Freq: Four times a day (QID) | ORAL | Status: DC | PRN

## 2018-03-11 MED ORDER — ALPRAZOLAM 0.5 MG PO TABS
0.5000 mg | ORAL_TABLET | Freq: Every evening | ORAL | Status: DC | PRN
  Administered 2018-03-11 – 2018-03-12 (×2): 0.5 mg via ORAL
  Filled 2018-03-11 (×2): qty 1

## 2018-03-11 MED ORDER — TRAMADOL HCL 50 MG PO TABS
50.0000 mg | ORAL_TABLET | Freq: Four times a day (QID) | ORAL | Status: DC | PRN
  Administered 2018-03-12 – 2018-03-14 (×7): 50 mg via ORAL
  Filled 2018-03-11 (×7): qty 1

## 2018-03-11 MED ORDER — SODIUM CHLORIDE 0.9 % IV SOLN
INTRAVENOUS | Status: DC
  Administered 2018-03-11 – 2018-03-14 (×3): via INTRAVENOUS

## 2018-03-11 MED ORDER — ONDANSETRON 4 MG PO TBDP: 4.0000 mg | ORAL_TABLET | Freq: Four times a day (QID) | ORAL | Status: DC | PRN

## 2018-03-11 MED ORDER — IOHEXOL 300 MG/ML  SOLN
75.0000 mL | Freq: Once | INTRAMUSCULAR | Status: AC | PRN
  Administered 2018-03-11: 75 mL via INTRAVENOUS

## 2018-03-11 NOTE — ED Notes (Signed)
Contacted surgery about pt's admission orders.

## 2018-03-11 NOTE — H&P (Signed)
SURGICAL CONSULTATION NOTE   HISTORY OF PRESENT ILLNESS (HPI):  67 y.o. female presented to [facility] ED for evaluation of abdominal pain. Patient reports she had colonoscopy on 03/08/18 as a screening colonoscopy. The patient refers that when she got home she still under the sedative effects and does not remember much. The next day she started feeling abdominal pain and abdominal distention. She refers is not passing gasses but does not has nausea. Pain is generalized but mostly localized on left side. , Denies fever or chills. Denies diarrhea. At ED, abdomina xray was done showing significant gas in the colon that is expected after colonoscopy. Other small bowel loops seen most likely ileus that is consistent with the abdominal distention. No free air under the diaphragm. . Then CT scan with IV and oral contrast was done showing showing colon distention but no small bowel distention. Oral contrast reach the ascending colon. Ct scan also shows an hematoma on the spleen without active IV contrast extravasation. Hemoglobin at ED was 12.1.   Patient denies multiple times any history of trauma.   Surgery is consulted by Dr. Karma Greaser in this context for evaluation and management of spleen laceration.  PAST MEDICAL HISTORY (PMH):  Past Medical History:  Diagnosis Date  . Anxiety   . Depression   . Emphysema, unspecified ([place])   . Insomnia   . Wears contact lenses   . Wears dentures    full upper     PAST SURGICAL HISTORY ([place]):  Past Surgical History:  Procedure Laterality Date  . COLONOSCOPY WITH PROPOFOL N/A 03/08/2018   Procedure: COLONOSCOPY WITH PROPOFOL;  Surgeon: Lucilla Lame, MD;  Location: [place];  Service: Endoscopy;  Laterality: N/A;  . NOSE SURGERY  2016   cauterization  . POLYPECTOMY N/A 03/08/2018   Procedure: POLYPECTOMY;  Surgeon: Lucilla Lame, MD;  Location: [place];  Service: Endoscopy;  Laterality: N/A;  . TOOTH EXTRACTION    . WISDOM TOOTH EXTRACTION        MEDICATIONS:  Prior to Admission medications   Medication Sig Start Date End Date Taking? Authorizing Provider  amitriptyline (ELAVIL) 50 MG tablet Take 1 tablet (50 mg total) by mouth 2 (two) times daily. Patient taking differently: Take 100 mg by mouth at bedtime.  12/25/17  Yes Volney American, PA-C  albuterol (PROVENTIL HFA;VENTOLIN HFA) 108 (90 Base) MCG/ACT inhaler Inhale 2 puffs into the lungs every 6 (six) hours as needed for wheezing or shortness of breath. 12/28/17   Volney American, PA-C  ALPRAZolam Duanne Moron) 0.5 MG tablet TAKE 1 TABLET(0.5 MG) BY MOUTH THREE TIMES DAILY AS NEEDED FOR ANXIETY 03/11/18   Volney American, PA-C     ALLERGIES:  Allergies  Allergen Reactions  . Bupropion Nausea Only     SOCIAL HISTORY:  Social History   Socioeconomic History  . Marital status: Legally Separated    Spouse name: Not on file  . Number of children: Not on file  . Years of education: Not on file  . Highest education level: Not on file  Occupational History  . Not on file  Social Needs  . Financial resource strain: Somewhat hard  . Food insecurity:    Worry: Never true    Inability: Never true  . Transportation needs:    Medical: No    Non-medical: No  Tobacco Use  . Smoking status: Current Every Day Smoker    Packs/day: 1.00    Years: 50.00    Pack years: 50.00  Types: Cigarettes  . Smokeless tobacco: Never Used  . Tobacco comment: since age 24  Substance and Sexual Activity  . Alcohol use: Yes    Alcohol/week: 1.8 oz    Types: 3 Cans of beer per week    Comment:    . Drug use: Never  . Sexual activity: Not Currently  Lifestyle  . Physical activity:    Days per week: 0 days    Minutes per session: 0 min  . Stress: Not at all  Relationships  . Social connections:    Talks on phone: More than three times a week    Gets together: More than three times a week    Attends religious service: Never    Active member of club or organization: No     Attends meetings of clubs or organizations: Never    Relationship status: Separated  . Intimate partner violence:    Fear of current or ex partner: No    Emotionally abused: No    Physically abused: No    Forced sexual activity: No  Other Topics Concern  . Not on file  Social History Narrative  . Not on file    The patient currently resides (home / rehab facility / nursing home): Home The patient normally is (ambulatory / bedbound): Ambulatory   FAMILY HISTORY:  Family History  Problem Relation Age of Onset  . Pulmonary fibrosis Mother   . Depression Mother   . Diabetes Father   . Lung disease Father   . Coronary artery disease Maternal Grandfather   . Diabetes Maternal Grandfather   . Breast cancer Maternal Aunt   . Breast cancer Maternal Aunt   . Lung cancer Maternal Uncle   . Lung cancer Paternal Aunt      REVIEW OF SYSTEMS:  Constitutional: denies weight loss, fever, chills, or sweats  Eyes: denies any other vision changes, history of eye injury  ENT: denies sore throat, hearing problems  Respiratory: denies shortness of breath, wheezing  Cardiovascular: denies chest pain, palpitations  Gastrointestinal: See HPI for pertinent positives and negatives.  Genitourinary: denies burning with urination or urinary frequency Musculoskeletal: denies any other joint pains or cramps  Skin: denies any other rashes or skin discolorations  Neurological: denies any other headache, dizziness, weakness  Psychiatric: denies any other depression, anxiety   All other review of systems were negative   VITAL SIGNS:  Temp:  [98.4 F (36.9 C)] 98.4 F (36.9 C) (07/11 1405) Pulse Rate:  [77-97] 97 (07/11 1900) Resp:  [16] 16 (07/11 1405) BP: (144-163)/(81-130) 152/104 (07/11 1900) SpO2:  [97 %-100 %] 98 % (07/11 1900) Weight:  [52.2 kg (115 lb)] 52.2 kg (115 lb) (07/11 1241)     Height: 5\' 3"  (160 cm) Weight: 52.2 kg (115 lb) BMI (Calculated): 20.38   INTAKE/OUTPUT:  This shift:  No intake/output data recorded.  Last 2 shifts: @IOLAST2SHIFTS @   PHYSICAL EXAM:  Constitutional:  -- Normal body habitus  -- Awake, alert, and oriented x3  Eyes:  -- Pupils equally round and reactive to light  -- No scleral icterus  Ear, nose, and throat:  -- No jugular venous distension  Pulmonary:  -- No crackles  -- Equal breath sounds bilaterally -- Breathing non-labored at rest Cardiovascular:  -- S1, S2 present  -- No pericardial rubs Gastrointestinal:  -- Abdomen soft, tender on all quadrant but no guarding and no rebound tenderness, distended and tympanic -- No abdominal masses appreciated, pulsatile or otherwise  Musculoskeletal and Integumentary:  --  Wounds or skin discoloration: None appreciated -- Extremities: B/L UE and LE FROM, hands and feet warm, no edema  Neurologic:  -- Motor function: intact and symmetric -- Sensation: intact and symmetric   Labs:  CBC Latest Ref Rng & Units 03/11/2018 09/03/2014 06/18/2007  WBC 3.6 - 11.0 K/uL 9.3 9.0 -  Hemoglobin 12.0 - 16.0 g/dL 12.1 15.4 15.6(H)  Hematocrit 35.0 - 47.0 % 34.6(L) 46.1 46.0  Platelets 150 - 440 K/uL 293 297 -   CMP Latest Ref Rng & Units 03/11/2018 09/03/2014 06/18/2007  Glucose 70 - 99 mg/dL 114(H) 88 88  BUN 8 - 23 mg/dL 8 8 9   Creatinine 0.44 - 1.00 mg/dL 0.96 0.95 0.9  Sodium 135 - 145 mmol/L 136 137 138  Potassium 3.5 - 5.1 mmol/L 3.7 3.7 3.8  Chloride 98 - 111 mmol/L 100 103 103  CO2 22 - 32 mmol/L 26 28 -  Calcium 8.9 - 10.3 mg/dL 9.5 9.3 -  Total Protein 6.5 - 8.1 g/dL 7.8 - 6.6  Total Bilirubin 0.3 - 1.2 mg/dL 1.1 - 0.6  Alkaline Phos 38 - 126 U/L 87 - 95  AST 15 - 41 U/L 19 - 22  ALT 0 - 44 U/L 12 - 13   Imaging studies:  EXAM: ABDOMEN - 1 VIEW  COMPARISON:  None.  FINDINGS: Air-filled colon is noted most consistent with recent colonoscopy. Stool is noted in the right colon. No other bowel dilatation is noted. No radio-opaque calculi or other significant  radiographic abnormality are seen.  IMPRESSION: Air-filled transverse colon is noted most consistent with recent colonoscopy. No other definite abnormality seen in the abdomen.  Electronically Signed   By: Marijo Conception, M.D.   On: 03/11/2018 13:23  CLINICAL DATA:  Abdominal distention after colonoscopy on 03/08/2018. Several polyps removed. No bowel movement since colonoscopy.  EXAM: CT ABDOMEN AND PELVIS WITH CONTRAST  TECHNIQUE: Multidetector CT imaging of the abdomen and pelvis was performed using the standard protocol following bolus administration of intravenous contrast.  CONTRAST:  92mL OMNIPAQUE IOHEXOL 300 MG/ML  SOLN  COMPARISON:  Abdomen radiographs 03/11/2018, chest CT  FINDINGS: Lower chest: Normal heart size without pericardial effusion. New small volume left pleural effusion. Five and 7 mm in average subpleural nodules in the left lower lobe in the setting of centrilobular emphysema previously described on recent CT of the chest. Consolidation in the lingula likely related to atelectasis.  Hepatobiliary: No enhancing mass lesions of the liver. Physiologic distention of the gallbladder without secondary signs of acute cholecystitis.  Pancreas: Atrophic pancreas.  Spleen: There is a splenic laceration with subcapsular hemorrhage and extra capsular blood products noted in the left upper quadrant and along the lateral and posterior aspect of the spleen. No evidence of active extravasation.  Adrenals/Urinary Tract: Adrenal glands are unremarkable. Kidneys are normal, without renal calculi, focal lesion, or hydronephrosis. Bladder is unremarkable.  Stomach/Bowel: No bowel perforation. Contrast reaches the colon without obstruction. No inflammation. Normal appendix.  Vascular/Lymphatic: Moderate aortoiliac atherosclerosis. No lymphadenopathy.  Reproductive: The uterus is partially obscured by the adjacent complex free fluid. No adnexal  mass.  Other: Complex moderate volume hyperdense fluid in the pelvis likely related to the splenic laceration, measuring 8.7 x 4.2 x 3.8 cm (volume = 73 cm^3). Lesser amount of fluid adjacent to the tip of the right hepatic lobe.  Musculoskeletal: T9 through T12 degenerative disc disease. No aggressive osseous abnormalities.  IMPRESSION: 1. Splenic laceration with subcapsular and moderate extracapsular blood products noted in  the left upper quadrant and pelvis with a smaller volume of fluid adjacent to the tip of the liver. These results were called by telephone at the time of interpretation on 03/11/2018 at 4:40 pm to Dr. Hinda Kehr , who verbally acknowledged these results. 2. No evidence of active extravasation. 3. Redemonstration of pulmonary nodules in the left lower lobe in the setting of centrilobular emphysema. Follow-up as recommended on recent chest CT. Small left pleural effusion.   Electronically Signed   By: Ashley Royalty M.D.   On: 03/11/2018 16:40  Assessment/Plan:  67 y.o. female with spleen laceration after colonoscopy. At this moment patient is completely stable, no tachycardia, sinus rhythm, normal blood pressure, adequate hemoglobin and no contrast extravasation on CT scan. I will admit the patient to observation for follow up of hemoglobin trend, keep patient hydrated. I will also follow her abdominal distention. Will consider suppository if no gas is passed tomorrow. Will hold Lovenox DVT prophylaxis until we see a stable trend of hemoglobin. Patient oriented to get out of bed to chair.   Patient oriented about the diagnosis of spleen laceration and about the plan of care which will try to maintain conservative management. Second option is IR embolization if hemoglobin trend down and last option is surgical management. Patient understood and agreed.   Arnold Long, MD

## 2018-03-11 NOTE — ED Triage Notes (Signed)
Pt presents to ED via POV with c/o abdominal distention. Pt states had a colonoscopy on 03/08/2018 with several polyps removed. Pt states has not had a bowel movement since prior to colonoscopy. Pt c/o abdominal pain and distention, states she feels like she is SHOB due to her stomach pressing up on her lungs, pt able to speak in complete sentences at this time. Pt states last time she passed gas was yesterday after taking OTC Gas-X.

## 2018-03-11 NOTE — ED Provider Notes (Signed)
[facility] Emergency Department Provider Note  ____________________________________________   First MD Initiated Contact with Patient 03/11/18 1340     (approximate)  I have reviewed the triage vital signs and the nursing notes.   HISTORY  Chief Complaint Abdominal Pain    HPI Chesney Beaird is a 67 y.o. female who presents by private vehicle for evaluation of gradually worsening abdominal pain and distention over the last 3 days.  She had a colonoscopy 3 days ago by Dr. Allen Norris and reports that she has not been able to have a bowel movement since that time.  She has passed a very minimal gas.  She has had no appetite and states that she has been drinking plenty of fluids but has been uninterested and unable to have much of any food.  Her abdominal distention is been getting gradually worse and it is currently severe.  Nothing makes the symptoms better or worse.  It is substantial enough that she feels like she cannot take a deep breath.  She denies fever/chills, chest pain, dysuria.  Past Medical History:  Diagnosis Date  . Anxiety   . Depression   . Emphysema, unspecified ([place])   . Insomnia   . Wears contact lenses   . Wears dentures    full upper    Patient Active Problem List   Diagnosis Date Noted  . Spleen laceration 03/11/2018  . Colon cancer screening   . Benign neoplasm of cecum   . Benign neoplasm of transverse colon   . Rectal polyp   . Polyp of sigmoid colon   . Emphysema, unspecified ([place]) 12/28/2017  . Anxiety 12/28/2017  . Depression 12/28/2017  . Insomnia 12/28/2017  . Tobacco use 12/28/2017    Past Surgical History:  Procedure Laterality Date  . COLONOSCOPY WITH PROPOFOL N/A 03/08/2018   Procedure: COLONOSCOPY WITH PROPOFOL;  Surgeon: Lucilla Lame, MD;  Location: [place];  Service: Endoscopy;  Laterality: N/A;  . NOSE SURGERY  2016   cauterization  . POLYPECTOMY N/A 03/08/2018   Procedure: POLYPECTOMY;   Surgeon: Lucilla Lame, MD;  Location: [place];  Service: Endoscopy;  Laterality: N/A;  . TOOTH EXTRACTION    . WISDOM TOOTH EXTRACTION      Prior to Admission medications   Medication Sig Start Date End Date Taking? Authorizing Provider  amitriptyline (ELAVIL) 50 MG tablet Take 1 tablet (50 mg total) by mouth 2 (two) times daily. Patient taking differently: Take 100 mg by mouth at bedtime.  12/25/17  Yes Volney American, PA-C  albuterol (PROVENTIL HFA;VENTOLIN HFA) 108 (90 Base) MCG/ACT inhaler Inhale 2 puffs into the lungs every 6 (six) hours as needed for wheezing or shortness of breath. 12/28/17   Volney American, PA-C  ALPRAZolam Duanne Moron) 0.5 MG tablet TAKE 1 TABLET(0.5 MG) BY MOUTH THREE TIMES DAILY AS NEEDED FOR ANXIETY 03/11/18   Volney American, PA-C    Allergies Bupropion  Family History  Problem Relation Age of Onset  . Pulmonary fibrosis Mother   . Depression Mother   . Diabetes Father   . Lung disease Father   . Coronary artery disease Maternal Grandfather   . Diabetes Maternal Grandfather   . Breast cancer Maternal Aunt   . Breast cancer Maternal Aunt   . Lung cancer Maternal Uncle   . Lung cancer Paternal Aunt     Social History Social History   Tobacco Use  . Smoking status: Current Every Day Smoker    Packs/day:  1.00    Years: 50.00    Pack years: 50.00    Types: Cigarettes  . Smokeless tobacco: Never Used  . Tobacco comment: since age 69  Substance Use Topics  . Alcohol use: Yes    Alcohol/week: 1.8 oz    Types: 3 Cans of beer per week    Comment:    . Drug use: Never    Review of Systems Constitutional: No fever/chills Eyes: No visual changes. ENT: No sore throat. Cardiovascular: Denies chest pain. Respiratory: Difficulty breathing secondary to the abdominal distention. Gastrointestinal: Generalized abdominal pain and distention gradually worsening over the last 3 days as described above.  Unable to have a bowel  movement and has passed only minimal gas. Genitourinary: Negative for dysuria. Musculoskeletal: Negative for neck pain.  Negative for back pain. Integumentary: Negative for rash. Neurological: Negative for headaches, focal weakness or numbness.   ____________________________________________   PHYSICAL EXAM:  ED Triage Vitals  Enc Vitals Group     BP 03/11/18 1405 (!) 144/81     Pulse Rate 03/11/18 1405 92     Resp 03/11/18 1405 16     Temp 03/11/18 1405 98.4 F (36.9 C)     Temp Source 03/11/18 1405 Oral     SpO2 03/11/18 1405 98 %     Weight 03/11/18 1241 52.2 kg (115 lb)     Height 03/11/18 1241 1.6 m (5\' 3" )     Head Circumference --      Peak Flow --      Pain Score 03/11/18 1240 9     Pain Loc --      Pain Edu? --      Excl. in [place]? --      Constitutional: Alert and oriented. Well appearing and in no acute distress. Eyes: Conjunctivae are normal.  Head: Atraumatic. Nose: No congestion/rhinnorhea. Mouth/Throat: Mucous membranes are moist. Neck: No stridor.  No meningeal signs.   Cardiovascular: Normal rate, regular rhythm. Good peripheral circulation. Grossly normal heart sounds. Respiratory: Normal respiratory effort.  No retractions. Lungs CTAB. Gastrointestinal: Thin body habitus.  Tense and distended abdomen.  Generalized tenderness palpation.   Musculoskeletal: No lower extremity tenderness nor edema. No gross deformities of extremities. Neurologic:  Normal speech and language. No gross focal neurologic deficits are appreciated.  Skin:  Skin is warm, dry and intact. No rash noted. Psychiatric: Mood and affect are normal. Speech and behavior are normal.  ____________________________________________   LABS (all labs ordered are listed, but only abnormal results are displayed)  Labs Reviewed  COMPREHENSIVE METABOLIC PANEL - Abnormal; Notable for the following components:      Result Value   Glucose, Bld 114 (*)    All other components within normal limits    CBC - Abnormal; Notable for the following components:   RBC 3.62 (*)    HCT 34.6 (*)    All other components within normal limits  URINALYSIS, COMPLETE (UACMP) WITH MICROSCOPIC - Abnormal; Notable for the following components:   Color, Urine YELLOW (*)    APPearance CLEAR (*)    Hgb urine dipstick MODERATE (*)    Leukocytes, UA MODERATE (*)    All other components within normal limits  URINE CULTURE  LIPASE, BLOOD  PROTIME-INR  APTT  TYPE AND SCREEN   ____________________________________________  EKG  ED ECG REPORT I, Hinda Kehr, the attending physician, personally viewed and interpreted this ECG.  Date: 03/11/2018 EKG Time: 12:41 PM Rate: 116 Rhythm: Sinus tachycardia QRS Axis: normal Intervals: normal  ST/T Wave abnormalities: Non-specific ST segment / T-wave changes, but no evidence of acute ischemia. Narrative Interpretation: no evidence of acute ischemia   ____________________________________________  RADIOLOGY I, Hinda Kehr, personally viewed and evaluated these images (plain radiographs) as part of my medical decision making, as well as reviewing the written report by the radiologist.  Also discussed the CT results by phone with radiology.  ED MD interpretation: Distention throughout the colon, possibly just a side effect of the recent colonoscopy with no free air noted. CT indicated splenic laceration.  Official radiology report(s): Dg Abdomen 1 View  Result Date: 03/11/2018 CLINICAL DATA:  Abdominal distention status post colonoscopy. EXAM: ABDOMEN - 1 VIEW COMPARISON:  None. FINDINGS: Air-filled colon is noted most consistent with recent colonoscopy. Stool is noted in the right colon. No other bowel dilatation is noted. No radio-opaque calculi or other significant radiographic abnormality are seen. IMPRESSION: Air-filled transverse colon is noted most consistent with recent colonoscopy. No other definite abnormality seen in the abdomen. Electronically Signed    By: Marijo Conception, M.D.   On: 03/11/2018 13:23   Ct Abdomen Pelvis W Contrast  Result Date: 03/11/2018 CLINICAL DATA:  Abdominal distention after colonoscopy on 03/08/2018. Several polyps removed. No bowel movement since colonoscopy. EXAM: CT ABDOMEN AND PELVIS WITH CONTRAST TECHNIQUE: Multidetector CT imaging of the abdomen and pelvis was performed using the standard protocol following bolus administration of intravenous contrast. CONTRAST:  50mL OMNIPAQUE IOHEXOL 300 MG/ML  SOLN COMPARISON:  Abdomen radiographs 03/11/2018, chest CT FINDINGS: Lower chest: Normal heart size without pericardial effusion. New small volume left pleural effusion. Five and 7 mm in average subpleural nodules in the left lower lobe in the setting of centrilobular emphysema previously described on recent CT of the chest. Consolidation in the lingula likely related to atelectasis. Hepatobiliary: No enhancing mass lesions of the liver. Physiologic distention of the gallbladder without secondary signs of acute cholecystitis. Pancreas: Atrophic pancreas. Spleen: There is a splenic laceration with subcapsular hemorrhage and extra capsular blood products noted in the left upper quadrant and along the lateral and posterior aspect of the spleen. No evidence of active extravasation. Adrenals/Urinary Tract: Adrenal glands are unremarkable. Kidneys are normal, without renal calculi, focal lesion, or hydronephrosis. Bladder is unremarkable. Stomach/Bowel: No bowel perforation. Contrast reaches the colon without obstruction. No inflammation. Normal appendix. Vascular/Lymphatic: Moderate aortoiliac atherosclerosis. No lymphadenopathy. Reproductive: The uterus is partially obscured by the adjacent complex free fluid. No adnexal mass. Other: Complex moderate volume hyperdense fluid in the pelvis likely related to the splenic laceration, measuring 8.7 x 4.2 x 3.8 cm (volume = 73 cm^3). Lesser amount of fluid adjacent to the tip of the right hepatic  lobe. Musculoskeletal: T9 through T12 degenerative disc disease. No aggressive osseous abnormalities. IMPRESSION: 1. Splenic laceration with subcapsular and moderate extracapsular blood products noted in the left upper quadrant and pelvis with a smaller volume of fluid adjacent to the tip of the liver. These results were called by telephone at the time of interpretation on 03/11/2018 at 4:40 pm to Dr. Hinda Kehr , who verbally acknowledged these results. 2. No evidence of active extravasation. 3. Redemonstration of pulmonary nodules in the left lower lobe in the setting of centrilobular emphysema. Follow-up as recommended on recent chest CT. Small left pleural effusion. Electronically Signed   By: Ashley Royalty M.D.   On: 03/11/2018 16:40    ____________________________________________   PROCEDURES  Critical Care performed: No   Procedure(s) performed:   Procedures   ____________________________________________  INITIAL IMPRESSION / ASSESSMENT AND PLAN / ED COURSE  As part of my medical decision making, I reviewed the following data within the [place] notes reviewed and incorporated, Labs reviewed , EKG interpreted , Old chart reviewed, Discussed with admitting physician (Dr. Peyton Najjar) and A consult was requested and obtained from this/these consultant(s) Surgery (Dr. Peyton Najjar)    Differential diagnosis includes, but is not limited to, ileus, SBO, bowel perforation with peritonitis, volvulus.  The patient's vital signs are within normal limits and she has no fever, no tachycardia, no SIRS criteria.  Similarly her lab work is reassuring with a normal CMP, lipase, and CBC with no leukocytosis.  However she does have significant abdominal distention and gradually worsening discomfort.  I believe this is most consistent with an ileus as there is no reason why she should have a mechanical bowel obstruction.  She reports that she has been able to drink a lot of fluids  even though she has not had an appetite and not been eating much food.  She has no evidence of dehydration and she has not been vomiting and has not even had any nausea.  I will obtain a CT scan of her abdomen and pelvis with oral and IV contrast for optimal evaluation particularly given her thin body habitus at baseline.  The patient understands and agrees with plan.  Clinical Course as of Mar 11 2050  Thu Mar 11, 2018  1547 WBC, UA: 6-10 [CF]  1642 Spoke by phone with Dr. Randel Pigg (radiology) who called because the patient has an acute spelnic laceration with some capsular bleeding.  No evidence of ileus nor SBO.  Will discuss with surgery.   [CF]  1709 I discussed the case by phone with Dr. Peyton Najjar with surgery.  He agreed the patient needs in hospital observation and will come see the patient in person and admit her for observation.  I have added on coagulation studies and have ordered a type and screen.  I updated the patient and her daughter.  She continues to have abdominal pain but is not in acute distress at this time.  I explained to her the results and the need for further observation and she understands and agrees with the plan.   [CF]  1849 Dr. Peyton Najjar evaluated the patient in person in the ED and I discussed the case with him in person.  He feels that this is unlikely to be operative but she does need observation.  We are awaiting admission orders at this time (he had to go to surgery before he put in the admission orders).I also called Dr. Allen Norris to let him know that the patient is here and being admitted and the circumstances regarding the admission.   [CF]    Clinical Course User Index [CF] Hinda Kehr, MD    ____________________________________________  FINAL CLINICAL IMPRESSION(S) / ED DIAGNOSES  Final diagnoses:  Splenic laceration, initial encounter  Abdominal distention  Generalized abdominal pain     MEDICATIONS GIVEN DURING THIS VISIT:  Medications  albuterol  (PROVENTIL) (2.5 MG/3ML) 0.083% nebulizer solution 3 mL (has no administration in time range)  ALPRAZolam (XANAX) tablet 0.5 mg (has no administration in time range)  amitriptyline (ELAVIL) tablet 100 mg (has no administration in time range)  iohexol (OMNIPAQUE) 300 MG/ML solution 75 mL (75 mLs Intravenous Contrast Given 03/11/18 1609)     ED Discharge Orders    None       Note:  This document was prepared using  [employer] forces training and education officer and may include unintentional dictation errors.    Hinda Kehr, MD 03/11/18 2052

## 2018-03-11 NOTE — Telephone Encounter (Signed)
Pt left vm she had a colonoscopy 03/08/18 and is having some difficulties please call pt

## 2018-03-11 NOTE — Telephone Encounter (Signed)
Pt returned my call and stated she has been having abdominal pain, bloating, shortness of breath, pain in her shoulders and a possible fever (pt doesn't have a thermometer to check) since her colonoscopy on Monday. She stated she has not been feeling well since the procedure. She has not had a bowel movement. I did advise her it is common not to have a bowel movement for several days after.  She stated her daughter bought her some GasX to try. This doesn't seem to help. She stated she feels something isn't right. I advised her since she is having such pain and shortness of breath, she should go to the ER to be evaluated. Pt will go today.

## 2018-03-11 NOTE — Telephone Encounter (Signed)
Your patient 

## 2018-03-11 NOTE — ED Notes (Signed)
Contacted Dr. Peyton Najjar about admission orders. States that he completed surgery and that he is putting orders in now.

## 2018-03-11 NOTE — ED Notes (Signed)
Assisted pt to toilet, NAD. Warm blankets given.

## 2018-03-11 NOTE — Telephone Encounter (Signed)
Pt called back and left another vm that her stomach is now swollen, she is having shortness of breath and a fever. I returned pt's called and advised her due to these symptoms, she will need to go to the ER to be evaluated. Will try pt again later.   Pt had a colonoscopy on Monday. Several polyps removed. Result letter mailed yesterday, 03/10/18.

## 2018-03-12 ENCOUNTER — Observation Stay: Payer: PPO

## 2018-03-12 DIAGNOSIS — S36032A Major laceration of spleen, initial encounter: Secondary | ICD-10-CM | POA: Diagnosis not present

## 2018-03-12 LAB — HEMOGLOBIN AND HEMATOCRIT, BLOOD
HCT: 30.7 % — ABNORMAL LOW (ref 35.0–47.0)
HCT: 29.9 % — ABNORMAL LOW (ref 35.0–47.0)
HEMATOCRIT: 32.8 % — AB (ref 35.0–47.0)
Hemoglobin: 10.5 g/dL — ABNORMAL LOW (ref 12.0–16.0)
Hemoglobin: 10.5 g/dL — ABNORMAL LOW (ref 12.0–16.0)
Hemoglobin: 11.1 g/dL — ABNORMAL LOW (ref 12.0–16.0)

## 2018-03-12 MED ORDER — GLYCERIN (LAXATIVE) 2 G RE SUPP
1.0000 | RECTAL | Status: DC | PRN
  Administered 2018-03-12 – 2018-03-13 (×2): 1 via RECTAL
  Filled 2018-03-12 (×3): qty 1

## 2018-03-12 MED ORDER — BOOST / RESOURCE BREEZE PO LIQD CUSTOM
1.0000 | Freq: Three times a day (TID) | ORAL | Status: DC
  Administered 2018-03-12 – 2018-03-13 (×2): 1 via ORAL

## 2018-03-12 MED ORDER — ADULT MULTIVITAMIN W/MINERALS CH
1.0000 | ORAL_TABLET | Freq: Every day | ORAL | Status: DC
  Administered 2018-03-12 – 2018-03-15 (×4): 1 via ORAL
  Filled 2018-03-12 (×4): qty 1

## 2018-03-12 NOTE — Progress Notes (Signed)
Pt educated to call dietary for clear tray. Pt states she ate chicken nuggets yesterday evening. Educated pt on clear liquid diet and encouraged to be compliant with diet. Pt verbalizes understanding.

## 2018-03-12 NOTE — Care Management Obs Status (Signed)
[place] NOTIFICATION   Patient Details  Name: Jordan Cantu MRN: [id number] Date of Birth: [birth year]   Medicare Observation Status Notification Given:  Yes    Beverly Sessions, RN 03/12/2018, 11:05 AM

## 2018-03-12 NOTE — Care Management (Signed)
Patient admitted for spleen laceration after colonoscopy. Patient lives at home alone.  Patient states that her daughter has been staying with her the last few days.  PCP Orene Desanctis.  Pharmacy [facility] in [place].  Patient denies any issues obtaining medications or issues with transportation. At baseline patient id independent.  No needs identified.

## 2018-03-12 NOTE — Progress Notes (Signed)
Initial Nutrition Assessment  DOCUMENTATION CODES:   Not applicable  INTERVENTION:   Boost Breeze po TID, each supplement provides 250 kcal and 9 grams of protein  MVI daily  NUTRITION DIAGNOSIS:   Inadequate oral intake related to acute illness as evidenced by other (comment)(pt on clear liquid diet and without adequate intake for >5 days).  GOAL:   Patient will meet greater than or equal to 90% of their needs  MONITOR:   PO intake, Supplement acceptance, Labs, Weight trends, Skin, Diet advancement, I & O's  REASON FOR ASSESSMENT:   Malnutrition Screening Tool    ASSESSMENT:   67 y.o. female with spleen laceration after colonoscopy on 7/8   Met with pt and family in room today. Pt reports good appetite but poor oral intake for 1 week pta. Pt reports her last meal was on Saturday. Pt did not eat anything after Saturday as she was prepping for a colonoscopy on 7/8. Pt without adequate oral intake for >5 days. Pt advanced to clear liquid 7/11; pt eating popcicles only as she does not like broths. Pt is willing to drink Boost Breeze while on clear liquids and Premier Protein when diet advances. Pt reports her UBW is around 123lbs. Per chart, pt appears fairly weight stable. RD will order supplements and MVI to help pt meet her estimated needs.   Medications reviewed and include: NaCl _0 /hr, pepcid  Labs reviewed: Hgb 10.5(L), Hct 29.9(L)  NUTRITION - FOCUSED PHYSICAL EXAM:    Most Recent Value  Orbital Region  No depletion  Upper Arm Region  Moderate depletion  Thoracic and Lumbar Region  Mild depletion  Buccal Region  No depletion  Temple Region  No depletion  Clavicle Bone Region  No depletion  Clavicle and Acromion Bone Region  No depletion  Scapular Bone Region  No depletion  Dorsal Hand  Severe depletion  Patellar Region  No depletion  Anterior Thigh Region  No depletion  Posterior Calf Region  No depletion  Edema (RD Assessment)  None  Hair  Reviewed   Eyes  Reviewed  Mouth  Reviewed  Skin  Reviewed  Nails  Reviewed     Diet Order:   Diet Order           Diet clear liquid Room service appropriate? Yes  Diet effective now         EDUCATION NEEDS:   Education needs have been addressed  Skin:  Skin Assessment: Reviewed RN Assessment(closed incision rectum )  Last BM:  7/8  Height:   Ht Readings from Last 1 Encounters:  03/11/18 _1  (1.6 m)    Weight:   Wt Readings from Last 1 Encounters:  03/11/18 116 lb 6.5 oz (52.8 kg)    Ideal Body Weight:  52.3 kg  BMI:  Body mass index is 20.62 kg/m.  Estimated Nutritional Needs:   Kcal:  1400-1600kcal/day   Protein:  63-74g/day   Fluid:  >1.4L/day   Koleen Distance MS, RD, LDN Pager #- [phone] Office#- [phone] After Hours Pager: [phone]

## 2018-03-12 NOTE — Progress Notes (Signed)
Patient ID: Jordan Cantu, female   DOB: [birth year], 67 y.o.   MRN: [id number]     [place] Hospital Day(s): 0.   Post op day(s):  Marland Kitchen   Interval History: Patient seen and examined, no acute events or new complaints overnight. Patient reports passed few gasses, denies nausea or vomiting. Refers feels pretty much the same as yesterday.    Vital signs in last 24 hours: [min-max] current  Temp:  [98 F (36.7 C)-98.4 F (36.9 C)] 98 F (36.7 C) (07/12 0523) Pulse Rate:  [77-97] 96 (07/12 0523) Resp:  [16-20] 20 (07/12 0523) BP: (143-167)/(81-130) 147/82 (07/12 0523) SpO2:  [97 %-100 %] 97 % (07/12 0523) Weight:  [52.2 kg (115 lb)-52.8 kg (116 lb 6.5 oz)] 52.8 kg (116 lb 6.5 oz) (07/11 2100)     Height: 5\' 3"  (160 cm) Weight: 52.8 kg (116 lb 6.5 oz) BMI (Calculated): 20.62    Physical Exam:  Constitutional: alert, cooperative and no distress  Cardiovascular: regular rate and sinus rhythm  Gastrointestinal: soft, mild-tender, and distended  Labs:  CBC Latest Ref Rng & Units 03/12/2018 03/11/2018 03/11/2018  WBC 3.6 - 11.0 K/uL - - 9.3  Hemoglobin 12.0 - 16.0 g/dL 10.5(L) 11.1(L) 12.1  Hematocrit 35.0 - 47.0 % 30.7(L) 31.5(L) 34.6(L)  Platelets 150 - 440 K/uL - - 293   CMP Latest Ref Rng & Units 03/11/2018 09/03/2014 06/18/2007  Glucose 70 - 99 mg/dL 114(H) 88 88  BUN 8 - 23 mg/dL 8 8 9   Creatinine 0.44 - 1.00 mg/dL 0.96 0.95 0.9  Sodium 135 - 145 mmol/L 136 137 138  Potassium 3.5 - 5.1 mmol/L 3.7 3.7 3.8  Chloride 98 - 111 mmol/L 100 103 103  CO2 22 - 32 mmol/L 26 28 -  Calcium 8.9 - 10.3 mg/dL 9.5 9.3 -  Total Protein 6.5 - 8.1 g/dL 7.8 - 6.6  Total Bilirubin 0.3 - 1.2 mg/dL 1.1 - 0.6  Alkaline Phos 38 - 126 U/L 87 - 95  AST 15 - 41 U/L 19 - 22  ALT 0 - 44 U/L 12 - 13   Imaging studies: No new pertinent imaging studies   Assessment/Plan:  67 y.o. female with spleen laceration after colonoscopy. Hemoglobin slowly going decreasing but patient continue with  same normal heart rate. Most likely dilutional since patient was dehydrated and now has been hydrated. Will continue to follow the trend of the Hgb/Hct. If continue to decrease will consult IR for recommendations. Will order abdominal Xray for evaluation of contrast transit and if non obstructive pattern will order suppository.      Arnold Long, MD

## 2018-03-13 LAB — URINE CULTURE
Culture: NO GROWTH
Special Requests: NORMAL

## 2018-03-13 LAB — HIV ANTIBODY (ROUTINE TESTING W REFLEX): HIV Screen 4th Generation wRfx: NONREACTIVE

## 2018-03-13 MED ORDER — PREMIER PROTEIN SHAKE
11.0000 [oz_av] | Freq: Two times a day (BID) | ORAL | Status: DC
  Administered 2018-03-13 – 2018-03-15 (×4): 11 [oz_av] via ORAL

## 2018-03-13 NOTE — Progress Notes (Signed)
Patient ID: Jordan Cantu, female   DOB: 1951/03/13, 67 y.o.   MRN: [id number]     [place] Hospital Day(s): 0.   Post op day(s):  Marland Kitchen   Interval History: Patient seen and examined, no acute events or new complaints overnight. Patient reports feeling well, but refers that she still with abdominal distention and passing small amount of gas. Patient had suppository yesterday without significant changes. Denies nausea or vomiting. Denies abdominal pain.   Vital signs in last 24 hours: [min-max] current  Temp:  [97.5 F (36.4 C)-97.9 F (36.6 C)] 97.8 F (36.6 C) (07/13 0505) Pulse Rate:  [90-97] 97 (07/13 0505) Resp:  [14-19] 18 (07/13 0505) BP: (121-145)/(72-97) 145/77 (07/13 0505) SpO2:  [94 %-99 %] 94 % (07/13 0505)     Height: 5\' 3"  (160 cm) Weight: 52.8 kg (116 lb 6.5 oz) BMI (Calculated): 20.62    Physical Exam:  Constitutional: alert, cooperative and no distress  Respiratory: breathing non-labored at rest  Cardiovascular: regular rate and sinus rhythm  Gastrointestinal: soft, non-tender. Mild distended. No guarding, or rebound. Positive for bowel sounds.   Labs:  CBC Latest Ref Rng & Units 03/12/2018 03/12/2018 03/12/2018  WBC 3.6 - 11.0 K/uL - - -  Hemoglobin 12.0 - 16.0 g/dL 11.1(L) 10.5(L) 10.5(L)  Hematocrit 35.0 - 47.0 % 32.8(L) 29.9(L) 30.7(L)  Platelets 150 - 440 K/uL - - -   CMP Latest Ref Rng & Units 03/11/2018 09/03/2014 06/18/2007  Glucose 70 - 99 mg/dL 114(H) 88 88  BUN 8 - 23 mg/dL 8 8 9   Creatinine 0.44 - 1.00 mg/dL 0.96 0.95 0.9  Sodium 135 - 145 mmol/L 136 137 138  Potassium 3.5 - 5.1 mmol/L 3.7 3.7 3.8  Chloride 98 - 111 mmol/L 100 103 103  CO2 22 - 32 mmol/L 26 28 -  Calcium 8.9 - 10.3 mg/dL 9.5 9.3 -  Total Protein 6.5 - 8.1 g/dL 7.8 - 6.6  Total Bilirubin 0.3 - 1.2 mg/dL 1.1 - 0.6  Alkaline Phos 38 - 126 U/L 87 - 95  AST 15 - 41 U/L 19 - 22  ALT 0 - 44 U/L 12 - 13   Imaging studies: No new pertinent imaging  studies  Assessment/Plan:  67 y.o. female with spleen laceration after colonoscopy. Hemoglobin today increased to 11.1. Patient continue without tachycardia and good blood pressures. Still with abdominal distention and passing few gasses. Will progress the diet to full liquid and assess for toleration. There is no nausea or vomiting and oral contrast reached the large intestine. This is a matter of waiting for the large intestine to restart functioning normally.   Arnold Long, MD

## 2018-03-14 ENCOUNTER — Observation Stay: Payer: PPO

## 2018-03-14 DIAGNOSIS — S36039A Unspecified laceration of spleen, initial encounter: Secondary | ICD-10-CM | POA: Diagnosis present

## 2018-03-14 DIAGNOSIS — Z818 Family history of other mental and behavioral disorders: Secondary | ICD-10-CM | POA: Diagnosis not present

## 2018-03-14 DIAGNOSIS — Z888 Allergy status to other drugs, medicaments and biological substances status: Secondary | ICD-10-CM | POA: Diagnosis not present

## 2018-03-14 DIAGNOSIS — Z79899 Other long term (current) drug therapy: Secondary | ICD-10-CM | POA: Diagnosis not present

## 2018-03-14 DIAGNOSIS — F419 Anxiety disorder, unspecified: Secondary | ICD-10-CM | POA: Diagnosis present

## 2018-03-14 DIAGNOSIS — K9189 Other postprocedural complications and disorders of digestive system: Secondary | ICD-10-CM | POA: Diagnosis present

## 2018-03-14 DIAGNOSIS — K567 Ileus, unspecified: Secondary | ICD-10-CM | POA: Diagnosis present

## 2018-03-14 DIAGNOSIS — R140 Abdominal distension (gaseous): Secondary | ICD-10-CM | POA: Diagnosis present

## 2018-03-14 DIAGNOSIS — Y838 Other surgical procedures as the cause of abnormal reaction of the patient, or of later complication, without mention of misadventure at the time of the procedure: Secondary | ICD-10-CM | POA: Diagnosis present

## 2018-03-14 DIAGNOSIS — E860 Dehydration: Secondary | ICD-10-CM | POA: Diagnosis present

## 2018-03-14 DIAGNOSIS — F17210 Nicotine dependence, cigarettes, uncomplicated: Secondary | ICD-10-CM | POA: Diagnosis present

## 2018-03-14 DIAGNOSIS — F329 Major depressive disorder, single episode, unspecified: Secondary | ICD-10-CM | POA: Diagnosis present

## 2018-03-14 MED ORDER — ENOXAPARIN SODIUM 40 MG/0.4ML ~~LOC~~ SOLN
40.0000 mg | SUBCUTANEOUS | Status: DC
  Administered 2018-03-14 – 2018-03-15 (×2): 40 mg via SUBCUTANEOUS
  Filled 2018-03-14 (×2): qty 0.4

## 2018-03-14 MED ORDER — SODIUM CHLORIDE 0.9% FLUSH: 3.0000 mL | INTRAVENOUS | Status: DC | PRN

## 2018-03-14 MED ORDER — SODIUM CHLORIDE 0.9 % IV SOLN: 250.0000 mL | INTRAVENOUS | Status: DC | PRN

## 2018-03-14 MED ORDER — SODIUM CHLORIDE 0.9% FLUSH
3.0000 mL | Freq: Two times a day (BID) | INTRAVENOUS | Status: DC
  Administered 2018-03-14 – 2018-03-15 (×2): 3 mL via INTRAVENOUS

## 2018-03-14 NOTE — Progress Notes (Signed)
Patient ID: Jordan Cantu, female   DOB: Jul 30, 1951, 67 y.o.   MRN: [id number]     [place] Hospital Day(s): 0.   Post op day(s):  Marland Kitchen   Interval History: Patient seen and examined, no acute events or new complaints overnight. Patient reports still not passing gasses, denies abdominal pain, nausea or vomiting.  Vital signs in last 24 hours: [min-max] current  Temp:  [97.5 F (36.4 C)-99.2 F (37.3 C)] 97.5 F (36.4 C) (07/14 0513) Pulse Rate:  [86-110] 86 (07/14 0513) Resp:  [18-20] 20 (07/14 0513) BP: (123-156)/(71-72) 123/71 (07/14 0513) SpO2:  [94 %-98 %] 98 % (07/14 0513)     Height: 5\' 3"  (160 cm) Weight: 52.8 kg (116 lb 6.5 oz) BMI (Calculated): 20.62    Physical Exam:  Constitutional: alert, cooperative and no distress  Respiratory: breathing non-labored at rest  Cardiovascular: regular rate and sinus rhythm  Gastrointestinal: soft, non-tender, and mild-distended but softer than yesterday. Bowel sounds present.   Labs:  CBC Latest Ref Rng & Units 03/12/2018 03/12/2018 03/12/2018  WBC 3.6 - 11.0 K/uL - - -  Hemoglobin 12.0 - 16.0 g/dL 11.1(L) 10.5(L) 10.5(L)  Hematocrit 35.0 - 47.0 % 32.8(L) 29.9(L) 30.7(L)  Platelets 150 - 440 K/uL - - -   CMP Latest Ref Rng & Units 03/11/2018 09/03/2014 06/18/2007  Glucose 70 - 99 mg/dL 114(H) 88 88  BUN 8 - 23 mg/dL 8 8 9   Creatinine 0.44 - 1.00 mg/dL 0.96 0.95 0.9  Sodium 135 - 145 mmol/L 136 137 138  Potassium 3.5 - 5.1 mmol/L 3.7 3.7 3.8  Chloride 98 - 111 mmol/L 100 103 103  CO2 22 - 32 mmol/L 26 28 -  Calcium 8.9 - 10.3 mg/dL 9.5 9.3 -  Total Protein 6.5 - 8.1 g/dL 7.8 - 6.6  Total Bilirubin 0.3 - 1.2 mg/dL 1.1 - 0.6  Alkaline Phos 38 - 126 U/L 87 - 95  AST 15 - 41 U/L 19 - 22  ALT 0 - 44 U/L 12 - 13   Imaging studies: No new pertinent imaging studies  Assessment/Plan:  67 y.o. female with spleen laceration and large bowel ileus after colonoscopy. Patient with HR ant 86, adequate blood pressure.  Hemoglobin yesterday increased after couple stable hemoglobin levels. No sign of active bleeding. Will start Lovenox as DVT prophylaxis. Large bowel ileus, not passing gas but the abdomen is less distended, softer. Bowel sounds are adequate. Will progress diet. The contrast was in the large intestine. Will order new Xrays to see if the contrast still in the ascending colon or if it finally moved distally.   Arnold Long, MD

## 2018-03-15 NOTE — Discharge Instructions (Signed)
°  Diet: Resume home heart healthy soft diet.   Activity: May increase activity slowly as tolerated.   Medications: Resume all home medications. May use laxatives or stool softener if developing constipation.   Call office [phone]) at any time if any questions, worsening pain, fevers/chills, bleeding, drainage from incision site, or other concerns.

## 2018-03-15 NOTE — Discharge Summary (Signed)
Physician Discharge Summary  Patient ID: Jordan Cantu MRN: [id number] DOB/AGE: 1951-04-23 67 y.o.  Admit date: 03/11/2018 Discharge date: 03/15/2018  Admission Diagnoses:  Spleen laceration  Large bowel ileus  Discharge Diagnoses:  Active Problems:   Spleen laceration   Large bowel ileud  Discharged Condition: good  Hospital Course: Patient admitted with spleen laceration and large bowel ileus. CT scan showed the hematoma around the spleen with no contrast extravasation and the distended colon with contrast in ascending large intestine. Hemoglobin dropped at the beginning due to hemodilution and stabilized finally increasing to 11.1. No tachycardia and adequate blood pressure. Large bowel ileus took more time to resolve. Today patient refers had good gas per rectum and the abdomen is softer and less distended. No nausea or vomiting. Tolerated soft diet yesterday all day.   Consults: None  Significant Diagnostic Studies:  CBC Latest Ref Rng & Units 03/12/2018 03/12/2018 03/12/2018  WBC 3.6 - 11.0 K/uL - - -  Hemoglobin 12.0 - 16.0 g/dL 11.1(L) 10.5(L) 10.5(L)  Hematocrit 35.0 - 47.0 % 32.8(L) 29.9(L) 30.7(L)  Platelets 150 - 440 K/uL - - -   CLINICAL DATA:  Abdominal distention after colonoscopy on 03/08/2018. Several polyps removed. No bowel movement since colonoscopy.  EXAM: CT ABDOMEN AND PELVIS WITH CONTRAST  TECHNIQUE: Multidetector CT imaging of the abdomen and pelvis was performed using the standard protocol following bolus administration of intravenous contrast.  CONTRAST:  71mL OMNIPAQUE IOHEXOL 300 MG/ML  SOLN  COMPARISON:  Abdomen radiographs 03/11/2018, chest CT  FINDINGS: Lower chest: Normal heart size without pericardial effusion. New small volume left pleural effusion. Five and 7 mm in average subpleural nodules in the left lower lobe in the setting of centrilobular emphysema previously described on recent CT of the chest. Consolidation in the  lingula likely related to atelectasis.  Hepatobiliary: No enhancing mass lesions of the liver. Physiologic distention of the gallbladder without secondary signs of acute cholecystitis.  Pancreas: Atrophic pancreas.  Spleen: There is a splenic laceration with subcapsular hemorrhage and extra capsular blood products noted in the left upper quadrant and along the lateral and posterior aspect of the spleen. No evidence of active extravasation.  Adrenals/Urinary Tract: Adrenal glands are unremarkable. Kidneys are normal, without renal calculi, focal lesion, or hydronephrosis. Bladder is unremarkable.  Stomach/Bowel: No bowel perforation. Contrast reaches the colon without obstruction. No inflammation. Normal appendix.  Vascular/Lymphatic: Moderate aortoiliac atherosclerosis. No lymphadenopathy.  Reproductive: The uterus is partially obscured by the adjacent complex free fluid. No adnexal mass.  Other: Complex moderate volume hyperdense fluid in the pelvis likely related to the splenic laceration, measuring 8.7 x 4.2 x 3.8 cm (volume = 73 cm^3). Lesser amount of fluid adjacent to the tip of the right hepatic lobe.  Musculoskeletal: T9 through T12 degenerative disc disease. No aggressive osseous abnormalities.  IMPRESSION: 1. Splenic laceration with subcapsular and moderate extracapsular blood products noted in the left upper quadrant and pelvis with a smaller volume of fluid adjacent to the tip of the liver. These results were called by telephone at the time of interpretation on 03/11/2018 at 4:40 pm to Dr. Hinda Kehr , who verbally acknowledged these results. 2. No evidence of active extravasation. 3. Redemonstration of pulmonary nodules in the left lower lobe in the setting of centrilobular emphysema. Follow-up as recommended on recent chest CT. Small left pleural effusion.   Electronically Signed   By: Ashley Royalty M.D.   On: 03/11/2018 16:40   Treatments:  IV hydration and conservative management. Glycerine  suppositiories.   Discharge Exam: Blood pressure (!) 145/76, pulse 94, temperature 98.9 F (37.2 C), temperature source Oral, resp. rate 18, height 5\' 3"  (1.6 m), weight 52.8 kg (116 lb 6.5 oz), SpO2 91 %. General appearance: alert and cooperative Resp: clear to auscultation bilaterally Cardio: regular rate and rhythm, S1, S2 normal, no murmur, click, rub or gallop GI: soft, non-tender; bowel sounds normal; no masses,  no organomegaly Extremities: extremities normal, atraumatic, no cyanosis or edema  Disposition: Discharge disposition: 01-Home or Self Care       Discharge Instructions    Increase activity slowly   Complete by:  As directed      Allergies as of 03/15/2018      Reactions   Bupropion Nausea Only      Medication List    TAKE these medications   albuterol 108 (90 Base) MCG/ACT inhaler Commonly known as:  PROVENTIL HFA;VENTOLIN HFA Inhale 2 puffs into the lungs every 6 (six) hours as needed for wheezing or shortness of breath.   ALPRAZolam 0.5 MG tablet Commonly known as:  XANAX TAKE 1 TABLET(0.5 MG) BY MOUTH THREE TIMES DAILY AS NEEDED FOR ANXIETY   amitriptyline 50 MG tablet Commonly known as:  ELAVIL Take 1 tablet (50 mg total) by mouth 2 (two) times daily. What changed:    how much to take  when to take this      Follow-up Information    Herbert Pun, MD Follow up in 2 week(s).   Specialty:  General Surgery Contact information: [place] [id number] [phone]           Signed: Herbert Pun 03/15/2018, 7:59 AM

## 2018-03-15 NOTE — Final Progress Note (Signed)
[place] Hospital Day(s): 1.   Post op day(s):  Marland Kitchen   Interval History: Patient seen and examined, no acute events or new complaints overnight. Patient reports passing gasses and feeling better, denies nausea or vomiting.  Vital signs in last 24 hours: [min-max] current  Temp:  [98.3 F (36.8 C)-98.9 F (37.2 C)] 98.9 F (37.2 C) (07/15 0506) Pulse Rate:  [91-100] 94 (07/15 0506) Resp:  [18] 18 (07/15 0506) BP: (113-145)/(73-78) 145/76 (07/15 0506) SpO2:  [91 %-95 %] 91 % (07/15 0506)     Height: 5\' 3"  (160 cm) Weight: 52.8 kg (116 lb 6.5 oz) BMI (Calculated): 20.62   Physical Exam:  Constitutional: alert, cooperative and no distress  Respiratory: breathing non-labored at rest  Cardiovascular: regular rate and sinus rhythm  Gastrointestinal: soft, non-tender, and non-distended  Labs:  CBC Latest Ref Rng & Units 03/12/2018 03/12/2018 03/12/2018  WBC 3.6 - 11.0 K/uL - - -  Hemoglobin 12.0 - 16.0 g/dL 11.1(L) 10.5(L) 10.5(L)  Hematocrit 35.0 - 47.0 % 32.8(L) 29.9(L) 30.7(L)  Platelets 150 - 440 K/uL - - -   CMP Latest Ref Rng & Units 03/11/2018 09/03/2014 06/18/2007  Glucose 70 - 99 mg/dL 114(H) 88 88  BUN 8 - 23 mg/dL 8 8 9   Creatinine 0.44 - 1.00 mg/dL 0.96 0.95 0.9  Sodium 135 - 145 mmol/L 136 137 138  Potassium 3.5 - 5.1 mmol/L 3.7 3.7 3.8  Chloride 98 - 111 mmol/L 100 103 103  CO2 22 - 32 mmol/L 26 28 -  Calcium 8.9 - 10.3 mg/dL 9.5 9.3 -  Total Protein 6.5 - 8.1 g/dL 7.8 - 6.6  Total Bilirubin 0.3 - 1.2 mg/dL 1.1 - 0.6  Alkaline Phos 38 - 126 U/L 87 - 95  AST 15 - 41 U/L 19 - 22  ALT 0 - 44 U/L 12 - 13    Imaging studies: Abdominal xray shows non obstructive gas patter with contrast in ascending colon.   Assessment/Plan:  67 y.o. female with spleen laceration and large bowel ileus. Yesterday tolerated soft diet. Passed gas and abdomen is less distended significantly. No abdominal pain. Patient ambulating. Oriented about the findings. Will discharge  patient home as she refers feels comfortable to go home since is passing gasses. Will follow her in 2 weeks at the clinic.   Arnold Long, MD

## 2018-03-17 ENCOUNTER — Telehealth: Payer: Self-pay

## 2018-03-17 NOTE — Telephone Encounter (Signed)
I have made the 2nd attempt to contact the patient or family member in charge, in order to follow up from recently being discharged from the hospital. I left a message on voicemail. 

## 2018-03-17 NOTE — Telephone Encounter (Signed)
I have made the 1st attempt to contact the patient or family member in charge, in order to follow up from recently being discharged from the hospital. I left a message on voicemail but I will make another attempt at a different time.   Direct call back - [phone] 

## 2018-04-02 DIAGNOSIS — S36039D Unspecified laceration of spleen, subsequent encounter: Secondary | ICD-10-CM | POA: Diagnosis not present

## 2018-04-02 DIAGNOSIS — K5900 Constipation, unspecified: Secondary | ICD-10-CM | POA: Diagnosis not present

## 2018-04-16 ENCOUNTER — Ambulatory Visit (INDEPENDENT_AMBULATORY_CARE_PROVIDER_SITE_OTHER): Payer: PPO | Admitting: Family Medicine

## 2018-04-16 ENCOUNTER — Encounter: Payer: Self-pay | Admitting: Family Medicine

## 2018-04-16 ENCOUNTER — Other Ambulatory Visit: Payer: Self-pay

## 2018-04-16 VITALS — BP 128/84 | HR 73 | Temp 97.5°F | Ht 65.3 in | Wt 116.6 lb

## 2018-04-16 DIAGNOSIS — Z720 Tobacco use: Secondary | ICD-10-CM | POA: Diagnosis not present

## 2018-04-16 DIAGNOSIS — F419 Anxiety disorder, unspecified: Secondary | ICD-10-CM

## 2018-04-16 DIAGNOSIS — G4700 Insomnia, unspecified: Secondary | ICD-10-CM | POA: Diagnosis not present

## 2018-04-16 DIAGNOSIS — Z79899 Other long term (current) drug therapy: Secondary | ICD-10-CM

## 2018-04-16 MED ORDER — ALPRAZOLAM 0.5 MG PO TABS: ORAL_TABLET | ORAL | 2 refills | Status: DC

## 2018-04-16 NOTE — Progress Notes (Signed)
BP 128/84   Pulse 73   Temp (!) 97.5 F (36.4 C) (Oral)   Ht 5' 5.3" (1.659 m)   Wt 116 lb 9.6 oz (52.9 kg)   SpO2 100%   BMI 19.23 kg/m    Subjective:    Patient ID: Jordan Cantu, female    DOB: 1951-08-08, 67 y.o.   MRN: [id number]  HPI: Jordan Cantu is a 67 y.o. female  Chief Complaint  Patient presents with  . Anxiety    72m fu  . Flu Vaccine    pt refused    Quit smoking on her own 7/7 when she presented for her Colonoscopy and had complications which caused a hospitalization. States smokers cough has already dissipated. Feeling great about the decision.   Sleeping very well using the elavil, taking 2 tabs nightly. Also helps with appetite. Denies morning sedation or any other side effects.   Taking xanax about 3 times daily, feels it's controlling her anxiety well. Denies sedation, falls.   GAD 7 : Generalized Anxiety Score 04/16/2018 12/25/2017  Nervous, Anxious, on Edge 1 3  Control/stop worrying 1 3  Worry too much - different things 0 3  Trouble relaxing 1 3  Restless 0 3  Easily annoyed or irritable 0 2  Afraid - awful might happen 1 1  Total GAD 7 Score 4 18  Anxiety Difficulty - Somewhat difficult   Past Medical History:  Diagnosis Date  . Anxiety   . Depression   . Emphysema, unspecified ([place])   . Insomnia   . Tobacco use 12/28/2017  . Wears contact lenses   . Wears dentures    full upper   Social History   Socioeconomic History  . Marital status: Legally Separated    Spouse name: Not on file  . Number of children: Not on file  . Years of education: Not on file  . Highest education level: Not on file  Occupational History  . Not on file  Social Needs  . Financial resource strain: Somewhat hard  . Food insecurity:    Worry: Never true    Inability: Never true  . Transportation needs:    Medical: No    Non-medical: No  Tobacco Use  . Smoking status: Current Every Day Smoker    Packs/day: 1.00    Years: 50.00   Pack years: 50.00    Types: Cigarettes  . Smokeless tobacco: Never Used  . Tobacco comment: since age 77  Substance and Sexual Activity  . Alcohol use: Yes    Alcohol/week: 3.0 standard drinks    Types: 3 Cans of beer per week    Comment:    . Drug use: Never  . Sexual activity: Not Currently  Lifestyle  . Physical activity:    Days per week: 0 days    Minutes per session: 0 min  . Stress: Not at all  Relationships  . Social connections:    Talks on phone: More than three times a week    Gets together: More than three times a week    Attends religious service: Never    Active member of club or organization: No    Attends meetings of clubs or organizations: Never    Relationship status: Separated  . Intimate partner violence:    Fear of current or ex partner: No    Emotionally abused: No    Physically abused: No    Forced sexual activity: No  Other Topics Concern  . Not  on file  Social History Narrative  . Not on file    Relevant past medical, surgical, family and social history reviewed and updated as indicated. Interim medical history since our last visit reviewed. Allergies and medications reviewed and updated.  Review of Systems  Per HPI unless specifically indicated above     Objective:    BP 128/84   Pulse 73   Temp (!) 97.5 F (36.4 C) (Oral)   Ht 5' 5.3" (1.659 m)   Wt 116 lb 9.6 oz (52.9 kg)   SpO2 100%   BMI 19.23 kg/m   Wt Readings from Last 3 Encounters:  04/16/18 116 lb 9.6 oz (52.9 kg)  03/11/18 116 lb 6.5 oz (52.8 kg)  03/08/18 115 lb (52.2 kg)    Physical Exam  Constitutional: She is oriented to person, place, and time. She appears well-developed and well-nourished. No distress.  HENT:  Head: Atraumatic.  Eyes: Pupils are equal, round, and reactive to light. Conjunctivae and EOM are normal.  Neck: Normal range of motion. Neck supple.  Cardiovascular: Normal rate and regular rhythm.  Pulmonary/Chest: Effort normal and breath sounds  normal.  Musculoskeletal: Normal range of motion.  Neurological: She is alert and oriented to person, place, and time.  Skin: Skin is warm and dry.  Psychiatric: She has a normal mood and affect. Her behavior is normal.  Nursing note and vitals reviewed.   Results for orders placed or performed in visit on 04/16/18  Urine drugs of abuse scrn w alc, routine (Ref Lab)  Result Value Ref Range   Amphetamines, Urine WILL FOLLOW    Barbiturate Quant, Ur WILL FOLLOW    Benzodiazepine Quant, Ur WILL FOLLOW    BENZODIAZEPINES WILL FOLLOW    NORDIAZEPAM WILL FOLLOW    NORDIAZEPAM GC/MS CONF WILL FOLLOW    Oxazepam WILL FOLLOW    Oxazepam GC/MS Conf WILL FOLLOW    HYDROXYALPRAZOLAM WILL FOLLOW    OH-Alprazolam GC/MS Conf WILL FOLLOW    Cannabinoid Quant, Ur WILL FOLLOW    Cannabinoid GC/MS, Ur WILL FOLLOW    CARBOXY THC GC/MS CONF WILL FOLLOW    Cocaine (Metab.) WILL FOLLOW    Opiate Quant, Ur WILL FOLLOW    PCP Quant, Ur WILL FOLLOW    Methadone Screen, Urine WILL FOLLOW    Propoxyphene WILL FOLLOW    Ethanol, Urine WILL FOLLOW       Assessment & Plan:   Problem List Items Addressed This Visit      Other   Anxiety - Primary    Under good control with xanax regimen. Controlled substance agreement signed, UDS pending. Continue current regimen      Relevant Medications   ALPRAZolam (XANAX) 0.5 MG tablet   Insomnia    Continue at 100 mg QHS prn. Notes excellent benefit.       Controlled substance agreement signed   Relevant Orders   Urine drugs of abuse scrn w alc, routine (Ref Lab) (Completed)   RESOLVED: Tobacco use    Was able to quit on her own the day of her colonoscopy 03/07/18. Will resolve this problem          Follow up plan: Return in about 3 months (around 07/17/2018) for Anxiety f/u.

## 2018-04-19 ENCOUNTER — Encounter: Payer: Self-pay | Admitting: Family Medicine

## 2018-04-19 NOTE — Assessment & Plan Note (Signed)
Was able to quit on her own the day of her colonoscopy 03/07/18. Will resolve this problem

## 2018-04-19 NOTE — Assessment & Plan Note (Signed)
Under good control with xanax regimen. Controlled substance agreement signed, UDS pending. Continue current regimen

## 2018-04-19 NOTE — Assessment & Plan Note (Signed)
Continue at 100 mg QHS prn. Notes excellent benefit.

## 2018-04-19 NOTE — Patient Instructions (Signed)
Follow up in 3 months

## 2018-04-20 LAB — URINE DRUGS OF ABUSE SCREEN W ALC, ROUTINE (REF LAB)
Amphetamines, Urine: NEGATIVE ng/mL
Barbiturate Quant, Ur: NEGATIVE ng/mL
Cocaine (Metab.): NEGATIVE ng/mL
Ethanol, Urine: NEGATIVE %
METHADONE SCREEN, URINE: NEGATIVE ng/mL
Opiate Quant, Ur: NEGATIVE ng/mL
PCP QUANT UR: NEGATIVE ng/mL
Propoxyphene: NEGATIVE ng/mL

## 2018-04-20 LAB — PANEL 799049
CANNABINOID GC/MS UR: POSITIVE — AB
CARBOXY THC GC/MS CONF: 62 ng/mL

## 2018-04-20 LAB — DRUG PROFILE 799031: BENZODIAZEPINES: NEGATIVE

## 2018-05-17 ENCOUNTER — Other Ambulatory Visit: Payer: Self-pay | Admitting: Family Medicine

## 2018-05-17 NOTE — Telephone Encounter (Signed)
Refill of elavil  LOV 04/16/18 [address]  [facility] 12/25/17  #60 3 refills  [place], [place] [address] AT [place]

## 2018-05-18 ENCOUNTER — Other Ambulatory Visit: Payer: Self-pay | Admitting: Family Medicine

## 2018-07-19 ENCOUNTER — Encounter: Payer: Self-pay | Admitting: Family Medicine

## 2018-07-19 ENCOUNTER — Ambulatory Visit (INDEPENDENT_AMBULATORY_CARE_PROVIDER_SITE_OTHER): Payer: PPO | Admitting: Family Medicine

## 2018-07-19 VITALS — BP 144/83 | HR 88 | Temp 97.7°F | Ht 65.3 in | Wt 125.4 lb

## 2018-07-19 DIAGNOSIS — Z23 Encounter for immunization: Secondary | ICD-10-CM | POA: Diagnosis not present

## 2018-07-19 DIAGNOSIS — G4700 Insomnia, unspecified: Secondary | ICD-10-CM

## 2018-07-19 DIAGNOSIS — F419 Anxiety disorder, unspecified: Secondary | ICD-10-CM

## 2018-07-19 MED ORDER — AMITRIPTYLINE HCL 50 MG PO TABS: 50.0000 mg | ORAL_TABLET | Freq: Every evening | ORAL | 5 refills | Status: DC | PRN

## 2018-07-19 MED ORDER — ALPRAZOLAM 0.5 MG PO TABS: ORAL_TABLET | ORAL | 2 refills | Status: DC

## 2018-07-19 NOTE — Progress Notes (Signed)
BP (!) 144/83 (BP Location: Left Arm, Patient Position: Sitting, Cuff Size: Normal)   Pulse 88   Temp 97.7 F (36.5 C)   Ht 5' 5.3" (1.659 m)   Wt 125 lb 6 oz (56.9 kg)   SpO2 98%   BMI 20.67 kg/m    Subjective:    Patient ID: Jordan Cantu, female    DOB: 11-19-1950, 67 y.o.   MRN: [id number]  HPI: Jordan Cantu is a 67 y.o. female  Chief Complaint  Patient presents with  . Anxiety  . Insomnia    Patient states that the Amtriptyline was only called in for 30 tablets and she was taking two at night   Here today for sleep and anxiety f/u. Tried 50 mg elavil at night and was waking up within hours. Needs to go back up to 2 tabs. Does not want the 100 mg dose as some nights she can get away with 1 if she goes to bed late enough. Denies side effects.   Taking her xanax 3 times daily. Doing very well with this regimen. Denies side effects, issues. Lots of work stress currently and states this helps significantly.   Depression screen [facility] 2/9 07/19/2018 04/16/2018 12/25/2017  Decreased Interest 0 1 0  Down, Depressed, Hopeless 0 1 3  PHQ - 2 Score 0 2 3  Altered sleeping 1 0 3  Tired, decreased energy 0 1 3  Change in appetite 0 0 3  Feeling bad or failure about yourself  0 1 1  Trouble concentrating 0 0 1  Moving slowly or fidgety/restless 0 1 3  Suicidal thoughts 0 0 0  PHQ-9 Score 1 5 17   Difficult doing work/chores Not difficult at all - Somewhat difficult   GAD 7 : Generalized Anxiety Score 07/19/2018 04/16/2018 12/25/2017  Nervous, Anxious, on Edge 0 1 3  Control/stop worrying 0 1 3  Worry too much - different things 0 0 3  Trouble relaxing 0 1 3  Restless 0 0 3  Easily annoyed or irritable 0 0 2  Afraid - awful might happen 0 1 1  Total GAD 7 Score 0 4 18  Anxiety Difficulty Not difficult at all - Somewhat difficult   Relevant past medical, surgical, family and social history reviewed and updated as indicated. Interim medical history since our last  visit reviewed. Allergies and medications reviewed and updated.  Review of Systems  Per HPI unless specifically indicated above     Objective:    BP (!) 144/83 (BP Location: Left Arm, Patient Position: Sitting, Cuff Size: Normal)   Pulse 88   Temp 97.7 F (36.5 C)   Ht 5' 5.3" (1.659 m)   Wt 125 lb 6 oz (56.9 kg)   SpO2 98%   BMI 20.67 kg/m   Wt Readings from Last 3 Encounters:  07/19/18 125 lb 6 oz (56.9 kg)  04/16/18 116 lb 9.6 oz (52.9 kg)  03/11/18 116 lb 6.5 oz (52.8 kg)    Physical Exam  Constitutional: She is oriented to person, place, and time. She appears well-developed and well-nourished. No distress.  HENT:  Head: Atraumatic.  Eyes: Conjunctivae and EOM are normal.  Neck: Normal range of motion. Neck supple.  Cardiovascular: Normal rate, regular rhythm and normal heart sounds.  Pulmonary/Chest: Effort normal and breath sounds normal.  Musculoskeletal: Normal range of motion.  Neurological: She is alert and oriented to person, place, and time.  Skin: Skin is warm and dry.  Psychiatric: She has  a normal mood and affect. Her behavior is normal.  Nursing note and vitals reviewed.   Results for orders placed or performed in visit on 04/16/18  Urine drugs of abuse scrn w alc, routine (Ref Lab)  Result Value Ref Range   Amphetamines, Urine Negative Cutoff=1000 ng/mL   Barbiturate Quant, Ur Negative Cutoff=300 ng/mL   Benzodiazepine Quant, Ur See Final Results Cutoff=300 ng/mL   Cannabinoid Quant, Ur See Final Results Cutoff=50 ng/mL   Cocaine (Metab.) Negative Cutoff=300 ng/mL   Opiate Quant, Ur Negative Cutoff=300 ng/mL   PCP Quant, Ur Negative Cutoff=25 ng/mL   Methadone Screen, Urine Negative Cutoff=300 ng/mL   Propoxyphene Negative Cutoff=300 ng/mL   Ethanol, Urine Negative Cutoff=0.020 %  Drug Profile [id number]  Result Value Ref Range   BENZODIAZEPINES Negative Cutoff=300  Panel [id number]  Result Value Ref Range   Cannabinoid GC/MS, Ur Positive (A)  Cutoff=50   CARBOXY THC GC/MS CONF 62 Cutoff=10 ng/mL      Assessment & Plan:   Problem List Items Addressed This Visit      Other   Anxiety - Primary    Stable, continue current regimen. Drug screen performed today as previous testing was neg for BZDs despite her stating she's taking TID. Await results      Relevant Medications   amitriptyline (ELAVIL) 50 MG tablet (Start on 07/26/2018)   ALPRAZolam (XANAX) 0.5 MG tablet (Start on 07/26/2018)   Other Relevant Orders   Benzodiazepines Confirm, Urine   Insomnia    Increase back up to 2 tabs of 50 mg elavil nightly. Sleep hygiene reviewed       Other Visit Diagnoses    Immunization due       Relevant Orders   Flu vaccine HIGH DOSE PF (Fluzone High dose) (Completed)       Follow up plan: Return in about 3 months (around 10/19/2018) for Anxiety f/u.

## 2018-07-20 NOTE — Assessment & Plan Note (Signed)
Stable, continue current regimen. Drug screen performed today as previous testing was neg for BZDs despite her stating she's taking TID. Await results

## 2018-07-20 NOTE — Assessment & Plan Note (Signed)
Increase back up to 2 tabs of 50 mg elavil nightly. Sleep hygiene reviewed

## 2018-07-24 LAB — BENZODIAZEPINES CONFIRM, URINE
ALPRAZOLAM CONFIRM: 733 ng/mL
ALPRAZOLAM: POSITIVE — AB
BENZODIAZEPINES: POSITIVE ng/mL — AB
CLONAZEPAM: NEGATIVE
Flurazepam: NEGATIVE
LORAZEPAM: NEGATIVE
MIDAZOLAM: NEGATIVE
NORDIAZEPAM: NEGATIVE
OXAZEPAM UR: NEGATIVE
TEMAZEPAM: NEGATIVE
TRIAZOLAM: NEGATIVE

## 2018-07-27 ENCOUNTER — Other Ambulatory Visit: Payer: Self-pay | Admitting: Family Medicine

## 2018-07-27 MED ORDER — ALBUTEROL SULFATE HFA 108 (90 BASE) MCG/ACT IN AERS: 2.0000 | INHALATION_SPRAY | Freq: Four times a day (QID) | RESPIRATORY_TRACT | 2 refills | Status: DC | PRN

## 2018-07-27 NOTE — Telephone Encounter (Signed)
Copied from [place]. Topic: General - Other >> Jul 27, 2018 10:51 AM Janace Aris A wrote: Medication:  albuterol (PROVENTIL HFA;VENTOLIN HFA) 108 (90 Base) MCG/ACT inhaler   Has the patient contacted their pharmacy? Yes  Preferred Pharmacy (with phone number or street name): [id number] - [place], [place] [address] AT [place]  [phone] (Phone) [phone] (Fax)    Agent: Please be advised that RX refills may take up to 3 business days. We ask that you follow-up with your pharmacy.

## 2018-07-28 ENCOUNTER — Other Ambulatory Visit: Payer: Self-pay | Admitting: Family Medicine

## 2018-07-28 NOTE — Telephone Encounter (Signed)
Called pharmacy request sent to [place] 07/27/2018 it was denied wasn't received 11/18

## 2018-07-28 NOTE — Telephone Encounter (Signed)
Can you call and check with pharmacy? They had to have received it because it was sent on 11/18 to be filled 11/25 so to have gotten that date I would assume they had to have seen the script

## 2018-07-28 NOTE — Telephone Encounter (Signed)
Patient is calling and states the pharmacy did not receive the Xanax that was sent on 07/26/18

## 2018-08-07 ENCOUNTER — Telehealth: Payer: Self-pay

## 2018-08-07 NOTE — Telephone Encounter (Signed)
Call pt regarding lung screening. Left message. 

## 2018-08-13 ENCOUNTER — Telehealth: Payer: Self-pay | Admitting: *Deleted

## 2018-08-13 NOTE — Telephone Encounter (Signed)
Left message for patient to notify them that it is time to schedule low dose lung cancer screening CT scan follow up imaging. Instructed patient to call back to verify information prior to the scan being scheduled.  

## 2018-08-17 ENCOUNTER — Encounter: Payer: Self-pay | Admitting: *Deleted

## 2018-08-17 ENCOUNTER — Telehealth: Payer: Self-pay | Admitting: *Deleted

## 2018-08-17 NOTE — Telephone Encounter (Signed)
Attempted to contact patient r/t LDCT Screening follow up due at this time.  No answer received, message left for patient to call [phone] to schedule appointment.   Letter sent

## 2018-09-29 ENCOUNTER — Telehealth: Payer: Self-pay | Admitting: *Deleted

## 2018-09-29 NOTE — Telephone Encounter (Signed)
Attempted to contact patient r/t LDCT Screening follow up due at this time.  No answer received, message left for patient to call [phone] to schedule appointment.    

## 2018-10-18 ENCOUNTER — Ambulatory Visit (INDEPENDENT_AMBULATORY_CARE_PROVIDER_SITE_OTHER): Payer: PPO | Admitting: Family Medicine

## 2018-10-18 ENCOUNTER — Encounter: Payer: Self-pay | Admitting: Family Medicine

## 2018-10-18 VITALS — BP 152/70 | HR 108 | Temp 97.8°F | Ht 65.3 in | Wt 123.4 lb

## 2018-10-18 DIAGNOSIS — G4700 Insomnia, unspecified: Secondary | ICD-10-CM

## 2018-10-18 DIAGNOSIS — F419 Anxiety disorder, unspecified: Secondary | ICD-10-CM | POA: Diagnosis not present

## 2018-10-18 MED ORDER — AMITRIPTYLINE HCL 50 MG PO TABS: 100.0000 mg | ORAL_TABLET | Freq: Every day | ORAL | 3 refills | Status: DC

## 2018-10-18 MED ORDER — ALPRAZOLAM 0.5 MG PO TABS: ORAL_TABLET | ORAL | 2 refills | Status: DC

## 2018-10-18 NOTE — Progress Notes (Signed)
BP (!) 152/70   Pulse (!) 108   Temp 97.8 F (36.6 C)   Ht 5' 5.3" (1.659 m)   Wt 123 lb 6 oz (56 kg)   SpO2 100%   BMI 20.34 kg/m    Subjective:    Patient ID: Jordan Cantu, female    DOB: 1950-10-22, 68 y.o.   MRN: [id number]  HPI: Jordan Cantu is a 68 y.o. female  Chief Complaint  Patient presents with  . Anxiety  . Insomnia    Patient has been taking 2 of her sleeping pills daily, instead of one, so now she is out of them and having trouble sleeping    Here today for anxiety and sleep f/u. Anxiety stable on xanax BID prn. Typically having to take regularly twice daily to control her sxs. Things have been worse the past week because her script for elavil was messed up and she ran completely out a week or two ago. Notes this made a huge difference in things and she has done very poorly off of it. Last fill of xanax on 2/4. Needing her elavil re-written to reflect that she takes two 50 mg tabs nightly for sleep. When she does have her medicine she sleeps well and her moods and anxiety. Denies SI/HI.   Depression screen [facility] 2/9 10/18/2018 07/19/2018 04/16/2018  Decreased Interest 1 0 1  Down, Depressed, Hopeless 2 0 1  PHQ - 2 Score 3 0 2  Altered sleeping 3 1 0  Tired, decreased energy 3 0 1  Change in appetite 2 0 0  Feeling bad or failure about yourself  0 0 1  Trouble concentrating 2 0 0  Moving slowly or fidgety/restless 3 0 1  Suicidal thoughts 0 0 0  PHQ-9 Score 16 1 5   Difficult doing work/chores Somewhat difficult Not difficult at all -   GAD 7 : Generalized Anxiety Score 10/18/2018 07/19/2018 04/16/2018 12/25/2017  Nervous, Anxious, on Edge 3 0 1 3  Control/stop worrying 1 0 1 3  Worry too much - different things 1 0 0 3  Trouble relaxing 3 0 1 3  Restless 3 0 0 3  Easily annoyed or irritable 1 0 0 2  Afraid - awful might happen 1 0 1 1  Total GAD 7 Score 13 0 4 18  Anxiety Difficulty Somewhat difficult Not difficult at all - Somewhat  difficult   Relevant past medical, surgical, family and social history reviewed and updated as indicated. Interim medical history since our last visit reviewed. Allergies and medications reviewed and updated.  Review of Systems  Per HPI unless specifically indicated above     Objective:    BP (!) 152/70   Pulse (!) 108   Temp 97.8 F (36.6 C)   Ht 5' 5.3" (1.659 m)   Wt 123 lb 6 oz (56 kg)   SpO2 100%   BMI 20.34 kg/m   Wt Readings from Last 3 Encounters:  10/18/18 123 lb 6 oz (56 kg)  07/19/18 125 lb 6 oz (56.9 kg)  04/16/18 116 lb 9.6 oz (52.9 kg)    Physical Exam Vitals signs and nursing note reviewed.  Constitutional:      Appearance: Normal appearance. She is not ill-appearing.  HENT:     Head: Atraumatic.  Eyes:     Extraocular Movements: Extraocular movements intact.     Conjunctiva/sclera: Conjunctivae normal.  Neck:     Musculoskeletal: Normal range of motion and neck supple.  Cardiovascular:  Rate and Rhythm: Normal rate and regular rhythm.     Heart sounds: Normal heart sounds.  Pulmonary:     Effort: Pulmonary effort is normal.     Breath sounds: Normal breath sounds.  Musculoskeletal: Normal range of motion.  Skin:    General: Skin is warm and dry.  Neurological:     Mental Status: She is alert and oriented to person, place, and time.  Psychiatric:        Mood and Affect: Mood normal.        Thought Content: Thought content normal.        Judgment: Judgment normal.     Results for orders placed or performed in visit on 07/19/18  Benzodiazepines Confirm, Urine  Result Value Ref Range   BENZODIAZEPINES Positive (A) Cutoff=100 ng/mL   NORDIAZEPAM Negative Cutoff=100   Oxazepam Negative Cutoff=100   Flurazepam Negative Cutoff=100   LORAZEPAM Negative Cutoff=100   ALPRAZOLAM Positive (A)    ALPRAZOLAM CONFIRM 733 Cutoff=100 ng/mL   CLONAZEPAM Negative Cutoff=100   TEMAZEPAM Negative Cutoff=100   TRIAZOLAM Negative Cutoff=100   MIDAZOLAM  Negative Cutoff=100   Please Note: Comment       Assessment & Plan:   Problem List Items Addressed This Visit      Other   Anxiety - Primary    Stable and under good control with elavil and xanax regimen, but poorly controlled after 2 weeks off elavil due to rx error. Will restart elavil, pt to call if things don't improve once back on the medicine      Relevant Medications   amitriptyline (ELAVIL) 50 MG tablet   ALPRAZolam (XANAX) 0.5 MG tablet (Start on 11/03/2018)   Insomnia    Will re-write elavil to reflect appropriate dosing of 100 mg nightly. Continue current regimen          Follow up plan: Return in about 3 months (around 01/16/2019) for anxiety, sleep f/u.

## 2018-10-22 NOTE — Assessment & Plan Note (Signed)
Stable and under good control with elavil and xanax regimen, but poorly controlled after 2 weeks off elavil due to rx error. Will restart elavil, pt to call if things don't improve once back on the medicine

## 2018-10-22 NOTE — Assessment & Plan Note (Signed)
Will re-write elavil to reflect appropriate dosing of 100 mg nightly. Continue current regimen

## 2018-11-15 ENCOUNTER — Other Ambulatory Visit: Payer: Self-pay | Admitting: Family Medicine

## 2018-11-15 NOTE — Telephone Encounter (Signed)
Requested medication (s) are due for refill today: yes  Requested medication (s) are on the active medication list: yes  Last refill:  07/27/2018  Future visit scheduled: yes  Notes to clinic:  Beta agonists failed  Requested Prescriptions  Pending Prescriptions Disp Refills   albuterol (PROVENTIL HFA;VENTOLIN HFA) 108 (90 Base) MCG/ACT inhaler [Pharmacy Med Name: ALBUTEROL HFA INH (200 PUFFS) 8.5GM] 25.5 g     Sig: INHALE 2 PUFFS INTO THE LUNGS EVERY 6 HOURS AS NEEDED FOR WHEEZING OR SHORTNESS OF BREATH     Pulmonology:  Beta Agonists Failed - 11/15/2018  7:13 AM      Failed - One inhaler should last at least one month. If the patient is requesting refills earlier, contact the patient to check for uncontrolled symptoms.      Passed - Valid encounter within last 12 months    Recent Outpatient Visits          4 weeks ago [place], [place], [place]   3 months ago [place], [place]   7 months ago [place], [place]   10 months ago Insomnia, unspecified type   [place], [place], [place]   10 months ago Seborrheic keratoses   [place], Lilia [place], [place]      Future Appointments            In 1 month  [employer], [place]   In 2 months [place], [place], [place], [place]

## 2018-11-18 ENCOUNTER — Telehealth: Payer: Self-pay | Admitting: Family Medicine

## 2018-11-18 NOTE — Telephone Encounter (Signed)
Copied from [place] [id number]. Topic: Medicare AWV >> Nov 18, 2018  4:53 PM Sherren Kerns wrote: Called to RESCHEDULE patient's Medicare Annual Wellness Visit with the Nurse Health Advisor, that is on 01/03/2019 at 10:15 AM.  We need to reschedule due to [facility] is out of the office that day.  If patient returns call, please note: their last AWV was on 12/28/2017, please RESCHEDULE AWV with N[facility] any date AFTER 12/29/2018.  Thank you! For any questions please contact: Janace Hoard at [phone] or Skype [email or web address]  Returned patient's call from 11/19/2018 at 10:50 AM.  No answer.  I left a message to call Lattie Haw at [phone] if she needed assistance.  I do see that she rescheduled her AWV on 01/03/2019 to 01/10/2019 at 8:15 AM.  Janace Hoard, Care Guide.

## 2018-11-19 ENCOUNTER — Ambulatory Visit: Payer: Self-pay | Admitting: Licensed Clinical Social Worker

## 2018-11-19 ENCOUNTER — Encounter: Payer: Self-pay | Admitting: Licensed Clinical Social Worker

## 2018-11-19 ENCOUNTER — Telehealth: Payer: Self-pay

## 2018-11-19 NOTE — Progress Notes (Signed)
LCSW completed erroneous encounter. Correction note entered successfully.  Eula Fried, BSW, MSW, [facility] [email or web address] Phone: [phone]

## 2018-11-19 NOTE — Progress Notes (Signed)
LCSW completed erroneous entire. Correction note entered successfully.   Eula Fried, BSW, MSW, [facility] Care [facility] [email or web address] Phone: [phone]

## 2018-11-19 NOTE — Chronic Care Management (AMB) (Signed)
  Chronic Care Management    Clinical Social Work CCM Outreach Note  11/19/2018 Name: Reeva Davern MRN: [id number] DOB: [birth year]  Tamaiya Bump is a 68 y.o. year old female who is a primary care patient of Volney American, [place] . The CCM team was consulted for assistance with Mental Health Counseling and Resources.   LCSW reached out to [name] today by phone to introduce and offer CCM services but was unable to reach her successfully. HIPPA compliant voice message left encouraging a return call whenever available.   Follow Up Plan: SW will follow up with patient by phone over the next 2 weeks  Eula Fried, [place], MSW, [email or web address] Phone: [phone]

## 2018-11-22 ENCOUNTER — Ambulatory Visit: Payer: Self-pay

## 2018-11-22 NOTE — Telephone Encounter (Signed)
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 She says on Friday, she woke up feeling bad, body aches, chills. She says she took Tylenol on Thursday, but nothing since then. She says she was with the elderly person she took to the beauty shop everyday this past weekend. She says she doesn't have a thermometer, but because of the chills, feels like she has a fever. She says other symptoms are a little runny nose, mild cough, mild SOB, but says she has allergies and COPD, so that's probably the reason she has those symptoms. She says she doesn't feel like coming out of the house today when I advised she will need to be seen by a provider. I called and spoke to [place], [facility] who advised to send this over and she will send it to the provider for the best disposition for the patient and to advise the patient to expect a call from the office with the decision. I advised the patient, she verbalized understanding.  Reason for Disposition . [1] Fever or feeling feverish AND [2] within 14 Days of COVID-19 EXPOSURE (Close Contact)  Answer Assessment - Initial Assessment Questions 1. TEMPERATURE: "What is the most recent temperature?"  "How was it measured?"      Haven't checked it 2. ONSET: "When did the fever start?"      Chills and body aches since Friday 3. SYMPTOMS: "Do you have any other symptoms besides the fever?"  (e.g., colds, headache, sore throat, earache, cough, rash, diarrhea, vomiting, abdominal pain)     Body aches, mild cough 4. CAUSE: If there are no symptoms, ask: "What do you think is causing the fever?"      I don't know 5. CONTACTS: "Does anyone else in the family have an infection?"     Been around people at a beauty shop on Thursday 6. TREATMENT: "What have you done so far to treat this fever?" (e.g., medications)     Tylenol 7. IMMUNOCOMPROMISE: "Do you have of the following: diabetes, HIV positive, splenectomy, cancer chemotherapy, chronic  steroid treatment, transplant patient, etc."     No 8. PREGNANCY: "Is there any chance you are pregnant?" "When was your last menstrual period?"     No 9. TRAVEL: "Have you traveled out of the country in the last month?" (e.g., travel history, exposures)     No  Answer Assessment - Initial Assessment Questions 1. CONFIRMED CASE: "Who is the person with the confirmed COVID-19 infection that you were exposed to?"     In a beauty shop around people, no one was sick that I know of 2. PLACE of CONTACT: "Where were you when you were exposed to COVID-19  (coronavirus disease 2019)?" (e.g., city, state, country)     N/A 3. TYPE of CONTACT: "How much contact was there?" (e.g., live in same house, work in same office, same school)     Close contact 4. DATE of CONTACT: "When did you have contact with a coronavirus patient?" (e.g., days)     Thursday-Sunday 5. DURATION of CONTACT: "How long were you in contact with the COVID-19 (coronavirus disease) patient?" (e.g., a few seconds, passed by person, a few minutes, live with the patient)     No positive person 6. SYMPTOMS: "Do you have any symptoms?" (e.g., fever, cough, breathing difficulty)     Fever (not able to check), chills, mild cough, mild SOB, mild runny nose 7. PREGNANCY OR POSTPARTUM: "Is there any  chance you are pregnant?" "When was your last menstrual period?" "Did you deliver in the last 2 weeks?"     No 8. HIGH RISK: "Do you have any heart or lung problems? Do you have a weakened immune system?" (e.g., CHF, COPD, asthma, HIV positive, chemotherapy, renal failure, diabetes mellitus, sickle cell anemia)    COPD, Emphysema  Protocols used: CORONAVIRUS (COVID-19) EXPOSURE-A-AH, FEVER-A-AH

## 2018-11-22 NOTE — Telephone Encounter (Signed)
Can do a video visit if able

## 2018-11-23 NOTE — Telephone Encounter (Signed)
Spoke with pt and she has an Iphone and can do a facetime visit.

## 2018-11-24 ENCOUNTER — Ambulatory Visit (INDEPENDENT_AMBULATORY_CARE_PROVIDER_SITE_OTHER): Payer: PPO | Admitting: Family Medicine

## 2018-11-24 ENCOUNTER — Encounter: Payer: Self-pay | Admitting: Family Medicine

## 2018-11-24 ENCOUNTER — Other Ambulatory Visit: Payer: Self-pay

## 2018-11-24 ENCOUNTER — Ambulatory Visit: Payer: Self-pay | Admitting: Family Medicine

## 2018-11-24 VITALS — Temp 98.0°F | Ht 65.3 in | Wt 120.0 lb

## 2018-11-24 DIAGNOSIS — M791 Myalgia, unspecified site: Secondary | ICD-10-CM | POA: Diagnosis not present

## 2018-11-24 DIAGNOSIS — R5383 Other fatigue: Secondary | ICD-10-CM | POA: Diagnosis not present

## 2018-11-24 DIAGNOSIS — R110 Nausea: Secondary | ICD-10-CM | POA: Diagnosis not present

## 2018-11-24 DIAGNOSIS — R52 Pain, unspecified: Secondary | ICD-10-CM

## 2018-11-24 NOTE — Telephone Encounter (Signed)
Someone called from the office ([name]) about 10 minutes ago.  I called the flow coordinator and let her know I had the pt on the line.    She said she would call the pt back now.    So I let the pt know this.   Pt said ok.  Call ended.

## 2018-11-24 NOTE — Telephone Encounter (Signed)
Please get her on the schedule

## 2018-11-24 NOTE — Progress Notes (Signed)
Temp 98 F (36.7 C) (Skin) Comment (Src): Pt reported.  Ht 5' 5.3" (1.659 m)   Wt 120 lb (54.4 kg)   BMI 19.79 kg/m    Subjective:    Patient ID: Jordan Cantu, female    DOB: Jan 23, 1951, 68 y.o.   MRN: [id number]  HPI: Jordan Cantu is a 68 y.o. female  Chief Complaint  Patient presents with  . Chills    Woke up friday feeling like she had a low grade fever  . Generalized Body Aches  . Nausea  . Diarrhea    Subsided currently. Still no appitite.   . Fatigue    . This visit was completed via FaceTime due to the restrictions of the COVID-19 pandemic. All issues as above were discussed and addressed. Physical exam was done as above through visual confirmation on FaceTime. If it was felt that the patient should be evaluated in the office, they were directed there. The patient verbally consented to this visit. . Location of the patient: home . Location of the provider: work . Those involved with this call:  . Provider: Merrie Roof, PA-C . CMA: Gerda Diss, CMA . Front Desk/Registration: Jill Side  . Time spent on call: 15 minutes discussing health concerns   Started 4-5 days ago with fatigue, low grade fever, body aches, nausea and diarrhea. The diarrhea has subsided some but otherwise sxs about the same. Has been trying ibuprofen with some relief. Has had a cough but no SOB or CP. Denies abdominal pain, urinary sxs, weakness, syncope, melena. No known sick contacts.   Relevant past medical, surgical, family and social history reviewed and updated as indicated. Interim medical history since our last visit reviewed. Allergies and medications reviewed and updated.  Review of Systems  Per HPI unless specifically indicated above     Objective:    Temp 98 F (36.7 C) (Skin) Comment (Src): Pt reported.  Ht 5' 5.3" (1.659 m)   Wt 120 lb (54.4 kg)   BMI 19.79 kg/m   Wt Readings from Last 3 Encounters:  11/24/18 120 lb (54.4 kg)  10/18/18 123 lb 6 oz (56  kg)  07/19/18 125 lb 6 oz (56.9 kg)    Physical Exam Vitals signs and nursing note reviewed.  Constitutional:      Appearance: Normal appearance. She is not ill-appearing.  HENT:     Head: Atraumatic.     Nose: Nose normal.     Mouth/Throat:     Mouth: Mucous membranes are moist.     Pharynx: Oropharynx is clear.  Eyes:     Extraocular Movements: Extraocular movements intact.     Conjunctiva/sclera: Conjunctivae normal.  Neck:     Musculoskeletal: Normal range of motion.  Cardiovascular:     Comments: Unable to assess Pulmonary:     Effort: Pulmonary effort is normal. No respiratory distress.     Comments: No audible wheezing or labored respirations, rate normal Abdominal:     General: There is no distension.     Comments: Had pt palpate her abdomen on video call, per my observation of this she appears to be nontender to palpation diffusely   Musculoskeletal: Normal range of motion.  Skin:    General: Skin is dry.     Findings: No erythema or rash.  Neurological:     Mental Status: She is alert and oriented to person, place, and time.  Psychiatric:        Mood and Affect: Mood normal.  Thought Content: Thought content normal.        Judgment: Judgment normal.     Results for orders placed or performed in visit on 07/19/18  Benzodiazepines Confirm, Urine  Result Value Ref Range   BENZODIAZEPINES Positive (A) Cutoff=100 ng/mL   NORDIAZEPAM Negative Cutoff=100   Oxazepam Negative Cutoff=100   Flurazepam Negative Cutoff=100   LORAZEPAM Negative Cutoff=100   ALPRAZOLAM Positive (A)    ALPRAZOLAM CONFIRM 733 Cutoff=100 ng/mL   CLONAZEPAM Negative Cutoff=100   TEMAZEPAM Negative Cutoff=100   TRIAZOLAM Negative Cutoff=100   MIDAZOLAM Negative Cutoff=100   Please Note: Comment       Assessment & Plan:   Problem List Items Addressed This Visit    None    Visit Diagnoses    Other fatigue    -  Primary   Suspect viral illness, supportive care including good  hydration and rest reviewed. Strict return precautions given.    Body aches           Follow up plan: Return for as scheduled.

## 2018-11-26 ENCOUNTER — Ambulatory Visit: Payer: Self-pay | Admitting: Licensed Clinical Social Worker

## 2018-11-26 ENCOUNTER — Telehealth: Payer: Self-pay

## 2018-11-26 NOTE — Chronic Care Management (AMB) (Signed)
  Chronic Care Management    Clinical Social Work CCM Outreach Note  11/26/2018 Name: Jordan Cantu MRN: [id number] DOB: Aug 22, 1951  Jordan Cantu is a 68 y.o. year old female who is a primary care patient of Volney American, [place] . The CCM team was consulted for assistance with Mental Health Counseling and Resources.   LCSW reached out to Jordan Cantu today by phone to introduce and offer CCM services but was unable to reach her successfully. HIPPA compliant voice message left encouraging a return call once available.   Follow Up Plan: SW will follow up with patient by phone over the next 2-3 weeks  Eula Fried, [place], MSW, [email or web address] Phone: [phone]

## 2018-12-02 ENCOUNTER — Telehealth: Payer: Self-pay | Admitting: Family Medicine

## 2018-12-02 ENCOUNTER — Ambulatory Visit (INDEPENDENT_AMBULATORY_CARE_PROVIDER_SITE_OTHER): Payer: PPO | Admitting: Family Medicine

## 2018-12-02 ENCOUNTER — Other Ambulatory Visit: Payer: Self-pay

## 2018-12-02 ENCOUNTER — Encounter: Payer: Self-pay | Admitting: Family Medicine

## 2018-12-02 VITALS — Ht 65.3 in

## 2018-12-02 DIAGNOSIS — J069 Acute upper respiratory infection, unspecified: Secondary | ICD-10-CM | POA: Diagnosis not present

## 2018-12-02 DIAGNOSIS — R509 Fever, unspecified: Secondary | ICD-10-CM

## 2018-12-02 DIAGNOSIS — B9789 Other viral agents as the cause of diseases classified elsewhere: Secondary | ICD-10-CM | POA: Diagnosis not present

## 2018-12-02 MED ORDER — AZITHROMYCIN 250 MG PO TABS: ORAL_TABLET | ORAL | 0 refills | Status: DC

## 2018-12-02 NOTE — Telephone Encounter (Signed)
Pt called and l/m said that she was returning a call from yesterday morning, pt also said that she think that she might have the flu and wants something called in to her pharmacy.

## 2018-12-02 NOTE — Progress Notes (Signed)
Ht 5' 5.3" (1.659 m)   BMI 19.79 kg/m    Subjective:    Patient ID: Jordan Cantu, female    DOB: [birth year], 68 y.o.   MRN: [id number]  HPI: Jordan Cantu is a 68 y.o. female  Chief Complaint  Patient presents with  . Fever    f/u 100.2 today. tried OTC mucinex, cold meds  . Sore Throat  . Generalized Body Aches    . This visit was completed via WebEx due to the restrictions of the COVID-19 pandemic. All issues as above were discussed and addressed. Physical exam was done as above through visual confirmation on WebEx. If it was felt that the patient should be evaluated in the office, they were directed there. The patient verbally consented to this visit. . Location of the patient: home . Location of the provider: work . Those involved with this call:  . Provider: Merrie Roof, PA-C . CMA: Lesle Chris, [place] . Front Desk/Registration: Jill Side  . Time spent on call: 23 minutes with patient face to face via video conference. More than 50% of this time was spent in counseling and coordination of care.   Still having low grade fevers, 99-100 and having sore throat, body aches, mild cough. Sxs started almost 2 weeks ago now and have improved, but pt still not feeling 100%. Taking OTC medications like mucinex and cold combinations with minimal relief. Has been quarantining since onset, no new sick contacts, travel. Denies CP, SOB, wheezing, facial pain or pressure, rashes, N/V/D.   Relevant past medical, surgical, family and social history reviewed and updated as indicated. Interim medical history since our last visit reviewed. Allergies and medications reviewed and updated.  Review of Systems  Per HPI unless specifically indicated above     Objective:    Ht 5' 5.3" (1.659 m)   BMI 19.79 kg/m   Wt Readings from Last 3 Encounters:  11/24/18 120 lb (54.4 kg)  10/18/18 123 lb 6 oz (56 kg)  07/19/18 125 lb 6 oz (56.9 kg)    Physical Exam Vitals signs and  nursing note reviewed.  Constitutional:      General: She is not in acute distress.    Appearance: Normal appearance.  HENT:     Head: Atraumatic.     Right Ear: External ear normal.     Left Ear: External ear normal.     Nose: Nose normal. No congestion.     Mouth/Throat:     Mouth: Mucous membranes are moist.     Pharynx: Oropharynx is clear. Posterior oropharyngeal erythema present.  Eyes:     Extraocular Movements: Extraocular movements intact.     Conjunctiva/sclera: Conjunctivae normal.  Neck:     Musculoskeletal: Normal range of motion.  Cardiovascular:     Comments: Unable to assess via virtual visit Pulmonary:     Effort: Pulmonary effort is normal. No respiratory distress.  Musculoskeletal: Normal range of motion.  Skin:    General: Skin is dry.     Findings: No erythema.  Neurological:     Mental Status: She is alert and oriented to person, place, and time.  Psychiatric:        Mood and Affect: Mood normal.        Thought Content: Thought content normal.        Judgment: Judgment normal.     Results for orders placed or performed in visit on 07/19/18  Benzodiazepines Confirm, Urine  Result Value Ref Range  BENZODIAZEPINES Positive (A) Cutoff=100 ng/mL   NORDIAZEPAM Negative Cutoff=100   Oxazepam Negative Cutoff=100   Flurazepam Negative Cutoff=100   LORAZEPAM Negative Cutoff=100   ALPRAZOLAM Positive (A)    ALPRAZOLAM CONFIRM 733 Cutoff=100 ng/mL   CLONAZEPAM Negative Cutoff=100   TEMAZEPAM Negative Cutoff=100   TRIAZOLAM Negative Cutoff=100   MIDAZOLAM Negative Cutoff=100   Please Note: Comment       Assessment & Plan:   Problem List Items Addressed This Visit    None    Visit Diagnoses    Fever, unspecified fever cause    -  Primary   Start abx, monitor closely for benefit. Will extend quarantine and follow up early next week to ensure readiness to return to work. Return precautions reviewed   Viral URI with cough       Will extend work note  into next week as she's still mildly febrile and not feeling back to baseline. Zpak sent in case secondary infection developing   Relevant Medications   azithromycin (ZITHROMAX) 250 MG tablet       Follow up plan: Return in about 4 days (around 12/06/2018) for URI f/u.

## 2018-12-02 NOTE — Telephone Encounter (Signed)
Pt was seen for flu like sxs 1 week ago, if still sick needs a follow up visit

## 2018-12-06 ENCOUNTER — Telehealth: Payer: Self-pay

## 2018-12-10 ENCOUNTER — Other Ambulatory Visit: Payer: Self-pay | Admitting: Family Medicine

## 2018-12-11 ENCOUNTER — Other Ambulatory Visit: Payer: Self-pay | Admitting: Family Medicine

## 2018-12-12 NOTE — Telephone Encounter (Signed)
rx

## 2018-12-15 ENCOUNTER — Ambulatory Visit: Payer: Self-pay | Admitting: Licensed Clinical Social Worker

## 2018-12-15 NOTE — Chronic Care Management (AMB) (Signed)
  Chronic Care Management    Clinical Social Work General Note  12/15/2018 Name: Jordan Cantu MRN: [id number] DOB: 07-03-1951  Jordan Cantu is a 68 y.o. year old female who is a primary care patient of Volney American, [place]. The CCM was consulted to assist the patient with Mental Health Counseling and Resources. LCSW completed outreach call to patient but was unable to reach patient successfully. HIPPA compliant voice message was left encouraging a return call once available.  Review of patient status, including review of consultants reports, relevant laboratory and other test results, and collaboration with appropriate care team members and the patient's provider was performed as part of comprehensive patient evaluation and provision of chronic care management services.    Follow Up Plan: SW will follow up with patient by phone over the next 2-4 weeks  Jordan Cantu, Jordan Cantu, MSW, [email or web address] Phone: [phone]

## 2018-12-24 ENCOUNTER — Ambulatory Visit: Payer: Self-pay | Admitting: Licensed Clinical Social Worker

## 2018-12-24 ENCOUNTER — Telehealth: Payer: Self-pay

## 2018-12-24 NOTE — Chronic Care Management (AMB) (Signed)
  Care Management Note   Jordan Cantu is a 68 y.o. year old female who is a primary care patient of Volney American, [place]. The CM team was consulted for assistance with chronic disease management and care coordination.   I reached out to [name] by phone today but was unable to reach patient to introduce CCM program and offer services. LCSW will continue to make additional outreach attempts.   Review of patient status, including review of consultants reports, relevant laboratory and other test results, and collaboration with appropriate care team members and the patient's provider was performed as part of comprehensive patient evaluation and provision of chronic care management services.   Follow Up Plan: Telephone follow up appointment with CCM team member scheduled for: 01/07/2019  Eula Fried, BSW, MSW, [email or web address] Phone: [phone]

## 2019-01-03 ENCOUNTER — Ambulatory Visit: Payer: Self-pay

## 2019-01-03 ENCOUNTER — Telehealth: Payer: Self-pay

## 2019-01-03 NOTE — Telephone Encounter (Signed)
Patient scheduled for an AWV on 01/10/2019 with [facility], Due to Covid-19 pandemic this is unable to be done in office, called patient to see if they are able to do this virtually/telephonically. Left message for patient to call back.  Direct call back [phone]

## 2019-01-07 ENCOUNTER — Ambulatory Visit: Payer: Self-pay | Admitting: Licensed Clinical Social Worker

## 2019-01-07 ENCOUNTER — Telehealth: Payer: Self-pay

## 2019-01-07 NOTE — Chronic Care Management (AMB) (Signed)
  Care Management Note   Jordan Cantu is a 68 y.o. year old female who is a primary care patient of Volney American, [place]. The CM team was consulted for assistance with chronic disease management and care coordination.   I reached out to [name] by phone today but was unable to reach her successfully. HIPPA compliant voice message left informing patient of LCSW's next outreach attempt on 01/11/2019.    Review of patient status, including review of consultants reports, relevant laboratory and other test results, and collaboration with appropriate care team members and the patient's provider was performed as part of comprehensive patient evaluation and provision of chronic care management services.   Follow Up Plan: The CM team will reach out to the patient again over the next 7 days.   Eula Fried, BSW, MSW, [facility] [email or web address] Phone: [phone]

## 2019-01-10 ENCOUNTER — Other Ambulatory Visit: Payer: Self-pay

## 2019-01-10 ENCOUNTER — Ambulatory Visit (INDEPENDENT_AMBULATORY_CARE_PROVIDER_SITE_OTHER): Payer: PPO

## 2019-01-10 VITALS — BP 130/82 | HR 78 | Temp 97.9°F | Resp 16 | Ht 63.4 in | Wt 125.7 lb

## 2019-01-10 DIAGNOSIS — Z780 Asymptomatic menopausal state: Secondary | ICD-10-CM | POA: Diagnosis not present

## 2019-01-10 DIAGNOSIS — Z1239 Encounter for other screening for malignant neoplasm of breast: Secondary | ICD-10-CM | POA: Diagnosis not present

## 2019-01-10 DIAGNOSIS — Z0000 Encounter for general adult medical examination without abnormal findings: Secondary | ICD-10-CM | POA: Diagnosis not present

## 2019-01-10 NOTE — Progress Notes (Addendum)
Subjective:   Jordan Cantu is a 68 y.o. female who presents for Medicare Annual (Subsequent) preventive examination.  Review of Systems:   Cardiac Risk Factors include: advanced age (>43men, >33 women);smoking/ tobacco exposure     Objective:     Vitals: BP 130/82 (BP Location: Right Arm, Patient Position: Sitting, Cuff Size: Normal)   Pulse 78   Temp 97.9 F (36.6 C) (Temporal)   Resp 16   Ht 5' 3.4" (1.61 m)   Wt 125 lb 11.2 oz (57 kg)   SpO2 97%   BMI 21.99 kg/m   Body mass index is 21.99 kg/m.  Advanced Directives 01/10/2019 03/11/2018 03/08/2018 12/28/2017  Does Patient Have a Medical Advance Directive? No No No Yes  Does patient want to make changes to medical advance directive? - - - Yes (MAU/Ambulatory/Procedural Areas - Information given)  Would patient like information on creating a medical advance directive? Yes (MAU/Ambulatory/Procedural Areas - Information given) No - Patient declined Yes (MAU/Ambulatory/Procedural Areas - Information given) -    A voluntary discussion about advance care planning including the explanation and discussion of advance directives was extensively discussed  with the patient.  Explanation about the health care proxy and Living will was reviewed and packet with forms with explanation of how to fill them out was given.  During this discussion, the patient was able to identify a health care proxy as (unsure, patient will discuss with her family) and plans to fill out the paperwork required.  Patient was offered a separate [place] visit for further assistance with forms.   Time spent with patient: 16 min Individuals present: Jordan Cantu (patient), Jordan Hill,LPN   Tobacco Social History   Tobacco Use  Smoking Status Current Some Day Smoker  . Packs/day: 1.00  . Years: 50.00  . Pack years: 50.00  . Types: Cigarettes  Smokeless Tobacco Never Used  Tobacco Comment   since age 47     Ready to quit: Yes Counseling  given: Yes Comment: since age 14   Clinical Intake:  Pre-visit preparation completed: Yes  Pain : No/denies pain Pain Score: 0-No pain     Nutritional Risks: None Diabetes: No  How often do you need to have someone help you when you read instructions, pamphlets, or other written materials from your doctor or pharmacy?: 1 - Never What is the last grade level you completed in school?: some college   Interpreter Needed?: No   Information entered by :: Jordan Hill,LPN   Past Medical History:  Diagnosis Date  . Anxiety   . Depression   . Emphysema, unspecified ([place])   . Insomnia   . Tobacco use 12/28/2017  . Wears contact lenses   . Wears dentures    full upper   Past Surgical History:  Procedure Laterality Date  . COLONOSCOPY WITH PROPOFOL N/A 03/08/2018   Procedure: COLONOSCOPY WITH PROPOFOL;  Surgeon: Lucilla Lame, MD;  Location: [facility];  Service: Endoscopy;  Laterality: N/A;  . NOSE SURGERY  2016   cauterization  . POLYPECTOMY N/A 03/08/2018   Procedure: POLYPECTOMY;  Surgeon: Lucilla Lame, MD;  Location: [facility];  Service: Endoscopy;  Laterality: N/A;  . TOOTH EXTRACTION    . WISDOM TOOTH EXTRACTION     Family History  Problem Relation Age of Onset  . Pulmonary fibrosis Mother   . Depression Mother   . Diabetes Father   . Lung disease Father   . Coronary artery disease Maternal Grandfather   .  Diabetes Maternal Grandfather   . Breast cancer Maternal Aunt   . Breast cancer Maternal Aunt   . Lung cancer Maternal Uncle   . Lung cancer Paternal Aunt    Social History   Socioeconomic History  . Marital status: Legally Separated    Spouse name: Not on file  . Number of children: Not on file  . Years of education: Not on file  . Highest education level: High school graduate  Occupational History  . Occupation: retired  Scientific laboratory technician  . Financial resource strain: Somewhat hard  . Food insecurity:    Worry: Never true    Inability:  Never true  . Transportation needs:    Medical: No    Non-medical: No  Tobacco Use  . Smoking status: Current Some Day Smoker    Packs/day: 1.00    Years: 50.00    Pack years: 50.00    Types: Cigarettes  . Smokeless tobacco: Never Used  . Tobacco comment: since age 7  Substance and Sexual Activity  . Alcohol use: Yes    Alcohol/week: 3.0 standard drinks    Types: 3 Cans of beer per week    Comment:    . Drug use: Never  . Sexual activity: Not Currently  Lifestyle  . Physical activity:    Days per week: 0 days    Minutes per session: 0 min  . Stress: Not at all  Relationships  . Social connections:    Talks on phone: More than three times a week    Gets together: More than three times a week    Attends religious service: Never    Active member of club or organization: No    Attends meetings of clubs or organizations: Never    Relationship status: Separated  Other Topics Concern  . Not on file  Social History Narrative  . Not on file    Outpatient Encounter Medications as of 01/10/2019  Medication Sig  . albuterol (PROVENTIL HFA;VENTOLIN HFA) 108 (90 Base) MCG/ACT inhaler INHALE 2 PUFFS INTO THE LUNGS EVERY 6 HOURS AS NEEDED FOR WHEEZING OR SHORTNESS OF BREATH  . ALPRAZolam (XANAX) 0.5 MG tablet TAKE 1 TABLET(0.5 MG) BY MOUTH THREE TIMES DAILY AS NEEDED FOR ANXIETY  . amitriptyline (ELAVIL) 50 MG tablet Take 2 tablets (100 mg total) by mouth at bedtime.  . [DISCONTINUED] azithromycin (ZITHROMAX) 250 MG tablet Take 2 tabs day one, then 1 tab daily until complete (Patient not taking: Reported on 01/10/2019)   No facility-administered encounter medications on file as of 01/10/2019.     Activities of Daily Living In your present state of health, do you have any difficulty performing the following activities: 01/10/2019 03/11/2018  Hearing? N N  Vision? N N  Difficulty concentrating or making decisions? N N  Walking or climbing stairs? N N  Dressing or bathing? N N  Doing  errands, shopping? N N  Preparing Food and eating ? N -  Using the Toilet? N -  In the past six months, have you accidently leaked urine? N -  Do you have problems with loss of bowel control? N -  Managing your Medications? N -  Managing your Finances? N -  Housekeeping or managing your Housekeeping? N -  Some recent data might be hidden    Patient Care Team: Volney American, PA-C as PCP - General (Family Medicine) Greg Cutter, LCSW as Social Worker (Licensed Clinical Social Worker)    Assessment:   This is a routine  wellness examination for Nasiya.  Exercise Activities and Dietary recommendations Current Exercise Habits: The patient does not participate in regular exercise at present, Exercise limited by: None identified  Goals    . Quit Smoking     Smoking cessation discussed       Fall Risk: Fall Risk  01/10/2019 12/02/2018 10/18/2018 07/19/2018 12/25/2017  Falls in the past year? 0 0 0 0 No  Number falls in past yr: - 0 0 0 -  Injury with Fall? - 0 0 0 -    FALL RISK PREVENTION PERTAINING TO THE HOME:  Any stairs in or around the home? Yes  If so, are there any without handrails? No   Home free of loose throw rugs in walkways, pet beds, electrical cords, etc? Yes  Adequate lighting in your home to reduce risk of falls? Yes   ASSISTIVE DEVICES UTILIZED TO PREVENT FALLS:  Life alert? No  Use of a cane, walker or w/c? No  Grab bars in the bathroom? No  Shower chair or bench in shower? No  Elevated toilet seat or a handicapped toilet? No   DME ORDERS:  DME order needed?  No   TIMED UP AND GO:  Was the test performed? Yes .  Length of time to ambulate 10 feet: 10 sec.   GAIT:  Appearance of gait: Gait steady and fast without the use of an assistive device.  Education: Fall risk prevention has been discussed.  Intervention(s) required? No   DME/home health order needed?  No    Depression Screen PHQ 2/9 Scores 01/10/2019 12/02/2018 10/18/2018  07/19/2018  PHQ - 2 Score 0 3 3 0  PHQ- 9 Score - 9 16 1      Cognitive Function     6CIT Screen 01/10/2019 12/28/2017  What Year? 0 points 0 points  What month? 0 points 0 points  What time? 0 points 0 points  Count back from 20 0 points 0 points  Months in reverse 0 points 0 points  Repeat phrase 0 points 0 points  Total Score 0 0    Immunization History  Administered Date(s) Administered  . Influenza, High Dose Seasonal PF 07/19/2018    Qualifies for Shingles Vaccine? Yes . Due for Shingrix. Education has been provided regarding the importance of this vaccine. Pt has been advised to call insurance company to determine out of pocket expense. Advised may also receive vaccine at local pharmacy or Health Dept. Verbalized acceptance and understanding.  Tdap: Although this vaccine is not a covered service during a Wellness Exam, does the patient still wish to receive this vaccine today?  No .  Education has been provided regarding the importance of this vaccine. Advised may receive this vaccine at local pharmacy or Health Dept. Aware to provide a copy of the vaccination record if obtained from local pharmacy or Health Dept. Verbalized acceptance and understanding.   Flu Vaccine: up to date   Pneumococcal Vaccine: Due for Pneumococcal vaccine. Does the patient want to receive this vaccine today?  No . Education has been provided regarding the importance of this vaccine but still declined. Advised may receive this vaccine at local pharmacy or Health Dept. Aware to provide a copy of the vaccination record if obtained from local pharmacy or Health Dept. Verbalized acceptance and understanding.   Screening Tests Health Maintenance  Topic Date Due  . TETANUS/TDAP  07/30/1970  . MAMMOGRAM  04/20/2011  . DEXA SCAN  07/30/2016  . PNA vac Low Risk Adult (1  of 2 - PCV13) 07/30/2016  . INFLUENZA VACCINE  04/02/2019  . COLONOSCOPY  03/08/2021  . Hepatitis C Screening  Completed    Cancer  Screenings:  Colorectal Screening: Completed 03/08/2018. Repeat every 3 years  Mammogram: Ordered. Pt provided with contact info and advised to call to schedule appt.   Bone Density: due, ordered  Lung Cancer Screening: (Low Dose CT Chest recommended if Age 52-80 years, 30 pack-year currently smoking OR have quit w/in 15years.) does qualify.  Completed 02/22/2018   Additional Screening:  Hepatitis C Screening: does not qualify; Completed 12/28/2017  Vision Screening: Recommended annual ophthalmology exams for early detection of glaucoma and other disorders of the eye. Is the patient up to date with their annual eye exam?  No     Dental Screening: Recommended annual dental exams for proper oral hygiene  Community Resource Referral:  CRR required this visit?  No       Plan:  I have personally reviewed and addressed the Medicare Annual Wellness questionnaire and have noted the following in the patient's chart:  A. Medical and social history B. Use of alcohol, tobacco or illicit drugs  C. Current medications and supplements D. Functional ability and status E.  Nutritional status F.  Physical activity G. Advance directives H. List of other physicians I.  Hospitalizations, surgeries, and ER visits in previous 12 months J.  [place] such as hearing and vision if needed, cognitive and depression L. Referrals and appointments   In addition, I have reviewed and discussed with patient certain preventive protocols, quality metrics, and best practice recommendations. A written personalized care plan for preventive services as well as general preventive health recommendations were provided to patient.   Signed,    Bevelyn Ngo, LPN  1/44/3154 Nurse Health Advisor   Nurse Notes:  Patient states she still has a lingering cough since she saw Apolonio Schneiders, she states she did start smoking and uses her albuterol as needed. Denies any fevers or SOB or weakness. She states she  has stayed in her house since April. Patient declines seeing PCP today states she comes in 01/17/2019 and will call if she needs anything prior to her appt. Advised PCP.

## 2019-01-10 NOTE — Patient Instructions (Signed)
Ms. Mccarroll , Thank you for taking time to come for your Medicare Wellness Visit. I appreciate your ongoing commitment to your health goals. Please review the following plan we discussed and let me know if I can assist you in the future.   Screening recommendations/referrals: Colonoscopy: completed 03/08/2018, due in 3 years Mammogram: ordered. Please call [phone] to schedule your mammogram.  Bone Density: ordered, Please call [phone] to schedule your bone density Recommended yearly ophthalmology/optometry visit for glaucoma screening and checkup Recommended yearly dental visit for hygiene and checkup  Vaccinations: Influenza vaccine: up to date  Pneumococcal vaccine: declined today  Tdap vaccine: due, check with your insurance company for coverage  Shingles vaccine: shingrix eligible, check with your insurance/pharmacy for coverage information     Advanced directives: Advance directive discussed with you today. I have provided a copy for you to complete at home and have notarized. Once this is complete please bring a copy in to our office so we can scan it into your chart.  Conditions/risks identified: smoking cessation   Next appointment: Follow up in one year for your annual wellness exam.    Preventive Care 65 Years and Older, Female Preventive care refers to lifestyle choices and visits with your health care provider that can promote health and wellness. What does preventive care include?  A yearly physical exam. This is also called an annual well check.  Dental exams once or twice a year.  Routine eye exams. Ask your health care provider how often you should have your eyes checked.  Personal lifestyle choices, including:  Daily care of your teeth and gums.  Regular physical activity.  Eating a healthy diet.  Avoiding tobacco and drug use.  Limiting alcohol use.  Practicing safe sex.  Taking low-dose aspirin every day.  Taking vitamin and mineral  supplements as recommended by your health care provider. What happens during an annual well check? The services and screenings done by your health care provider during your annual well check will depend on your age, overall health, lifestyle risk factors, and family history of disease. Counseling  Your health care provider may ask you questions about your:  Alcohol use.  Tobacco use.  Drug use.  Emotional well-being.  Home and relationship well-being.  Sexual activity.  Eating habits.  History of falls.  Memory and ability to understand (cognition).  Work and work Statistician.  Reproductive health. Screening  You may have the following tests or measurements:  Height, weight, and BMI.  Blood pressure.  Lipid and cholesterol levels. These may be checked every 5 years, or more frequently if you are over 49 years old.  Skin check.  Lung cancer screening. You may have this screening every year starting at age 84 if you have a 30-pack-year history of smoking and currently smoke or have quit within the past 15 years.  Fecal occult blood test (FOBT) of the stool. You may have this test every year starting at age 53.  Flexible sigmoidoscopy or colonoscopy. You may have a sigmoidoscopy every 5 years or a colonoscopy every 10 years starting at age 61.  Hepatitis C blood test.  Hepatitis B blood test.  Sexually transmitted disease (STD) testing.  Diabetes screening. This is done by checking your blood sugar (glucose) after you have not eaten for a while (fasting). You may have this done every 1-3 years.  Bone density scan. This is done to screen for osteoporosis. You may have this done starting at age 71.  Mammogram. This may  be done every 1-2 years. Talk to your health care provider about how often you should have regular mammograms. Talk with your health care provider about your test results, treatment options, and if necessary, the need for more tests. Vaccines  Your  health care provider may recommend certain vaccines, such as:  Influenza vaccine. This is recommended every year.  Tetanus, diphtheria, and acellular pertussis (Tdap, Td) vaccine. You may need a Td booster every 10 years.  Zoster vaccine. You may need this after age 17.  Pneumococcal 13-valent conjugate (PCV13) vaccine. One dose is recommended after age 11.  Pneumococcal polysaccharide (PPSV23) vaccine. One dose is recommended after age 14. Talk to your health care provider about which screenings and vaccines you need and how often you need them. This information is not intended to replace advice given to you by your health care provider. Make sure you discuss any questions you have with your health care provider. Document Released: 09/14/2015 Document Revised: 05/07/2016 Document Reviewed: 06/19/2015 Elsevier Interactive Patient Education  2017 [place] Prevention in the Home Falls can cause injuries. They can happen to people of all ages. There are many things you can do to make your home safe and to help prevent falls. What can I do on the outside of my home?  Regularly fix the edges of walkways and driveways and fix any cracks.  Remove anything that might make you trip as you walk through a door, such as a raised step or threshold.  Trim any bushes or trees on the path to your home.  Use bright outdoor lighting.  Clear any walking paths of anything that might make someone trip, such as rocks or tools.  Regularly check to see if handrails are loose or broken. Make sure that both sides of any steps have handrails.  Any raised decks and porches should have guardrails on the edges.  Have any leaves, snow, or ice cleared regularly.  Use sand or salt on walking paths during winter.  Clean up any spills in your garage right away. This includes oil or grease spills. What can I do in the bathroom?  Use night lights.  Install grab bars by the toilet and in the tub and  shower. Do not use towel bars as grab bars.  Use non-skid mats or decals in the tub or shower.  If you need to sit down in the shower, use a plastic, non-slip stool.  Keep the floor dry. Clean up any water that spills on the floor as soon as it happens.  Remove soap buildup in the tub or shower regularly.  Attach bath mats securely with double-sided non-slip rug tape.  Do not have throw rugs and other things on the floor that can make you trip. What can I do in the bedroom?  Use night lights.  Make sure that you have a light by your bed that is easy to reach.  Do not use any sheets or blankets that are too big for your bed. They should not hang down onto the floor.  Have a firm chair that has side arms. You can use this for support while you get dressed.  Do not have throw rugs and other things on the floor that can make you trip. What can I do in the kitchen?  Clean up any spills right away.  Avoid walking on wet floors.  Keep items that you use a lot in easy-to-reach places.  If you need to reach something above you,  use a strong step stool that has a grab bar.  Keep electrical cords out of the way.  Do not use floor polish or wax that makes floors slippery. If you must use wax, use non-skid floor wax.  Do not have throw rugs and other things on the floor that can make you trip. What can I do with my stairs?  Do not leave any items on the stairs.  Make sure that there are handrails on both sides of the stairs and use them. Fix handrails that are broken or loose. Make sure that handrails are as long as the stairways.  Check any carpeting to make sure that it is firmly attached to the stairs. Fix any carpet that is loose or worn.  Avoid having throw rugs at the top or bottom of the stairs. If you do have throw rugs, attach them to the floor with carpet tape.  Make sure that you have a light switch at the top of the stairs and the bottom of the stairs. If you do not  have them, ask someone to add them for you. What else can I do to help prevent falls?  Wear shoes that:  Do not have high heels.  Have rubber bottoms.  Are comfortable and fit you well.  Are closed at the toe. Do not wear sandals.  If you use a stepladder:  Make sure that it is fully opened. Do not climb a closed stepladder.  Make sure that both sides of the stepladder are locked into place.  Ask someone to hold it for you, if possible.  Clearly mark and make sure that you can see:  Any grab bars or handrails.  First and last steps.  Where the edge of each step is.  Use tools that help you move around (mobility aids) if they are needed. These include:  Canes.  Walkers.  Scooters.  Crutches.  Turn on the lights when you go into a dark area. Replace any light bulbs as soon as they burn out.  Set up your furniture so you have a clear path. Avoid moving your furniture around.  If any of your floors are uneven, fix them.  If there are any pets around you, be aware of where they are.  Review your medicines with your doctor. Some medicines can make you feel dizzy. This can increase your chance of falling. Ask your doctor what other things that you can do to help prevent falls. This information is not intended to replace advice given to you by your health care provider. Make sure you discuss any questions you have with your health care provider. Document Released: 06/14/2009 Document Revised: 01/24/2016 Document Reviewed: 09/22/2014 Elsevier Interactive Patient Education  2017 [employer].   Steps to Quit Smoking  Smoking tobacco can be bad for your health. It can also affect almost every organ in your body. Smoking puts you and people around you at risk for many serious long-lasting (chronic) diseases. Quitting smoking is hard, but it is one of the best things that you can do for your health. It is never too late to quit. What are the benefits of quitting smoking?  When you quit smoking, you lower your risk for getting serious diseases and conditions. They can include:  Lung cancer or lung disease.  Heart disease.  Stroke.  Heart attack.  Not being able to have children (infertility).  Weak bones (osteoporosis) and broken bones (fractures). If you have coughing, wheezing, and shortness of breath, those  symptoms may get better when you quit. You may also get sick less often. If you are pregnant, quitting smoking can help to lower your chances of having a baby of low birth weight. What can I do to help me quit smoking? Talk with your doctor about what can help you quit smoking. Some things you can do (strategies) include:  Quitting smoking totally, instead of slowly cutting back how much you smoke over a period of time.  Going to in-person counseling. You are more likely to quit if you go to many counseling sessions.  Using resources and support systems, such as: ? Database administrator with a Social worker. ? Phone quitlines. ? Careers information officer. ? Support groups or group counseling. ? Text messaging programs. ? Mobile phone apps or applications.  Taking medicines. Some of these medicines may have nicotine in them. If you are pregnant or breastfeeding, do not take any medicines to quit smoking unless your doctor says it is okay. Talk with your doctor about counseling or other things that can help you. Talk with your doctor about using more than one strategy at the same time, such as taking medicines while you are also going to in-person counseling. This can help make quitting easier. What things can I do to make it easier to quit? Quitting smoking might feel very hard at first, but there is a lot that you can do to make it easier. Take these steps:  Talk to your family and friends. Ask them to support and encourage you.  Call phone quitlines, reach out to support groups, or work with a Social worker.  Ask people who smoke to not smoke around you.   Avoid places that make you want (trigger) to smoke, such as: ? Bars. ? Parties. ? Smoke-break areas at work.  Spend time with people who do not smoke.  Lower the stress in your life. Stress can make you want to smoke. Try these things to help your stress: ? Getting regular exercise. ? Deep-breathing exercises. ? Yoga. ? Meditating. ? Doing a body scan. To do this, close your eyes, focus on one area of your body at a time from head to toe, and notice which parts of your body are tense. Try to relax the muscles in those areas.  Download or buy apps on your mobile phone or tablet that can help you stick to your quit plan. There are many free apps, such as QuitGuide from the [employer] Office manager for Disease Control and Prevention). You can find more support from smokefree.gov and other websites. This information is not intended to replace advice given to you by your health care provider. Make sure you discuss any questions you have with your health care provider. Document Released: 06/14/2009 Document Revised: 04/15/2016 Document Reviewed: 01/02/2015 Elsevier Interactive Patient Education  2019 [employer].

## 2019-01-11 ENCOUNTER — Ambulatory Visit (INDEPENDENT_AMBULATORY_CARE_PROVIDER_SITE_OTHER): Payer: PPO | Admitting: Licensed Clinical Social Worker

## 2019-01-11 DIAGNOSIS — F419 Anxiety disorder, unspecified: Secondary | ICD-10-CM

## 2019-01-11 DIAGNOSIS — J439 Emphysema, unspecified: Secondary | ICD-10-CM | POA: Diagnosis not present

## 2019-01-11 DIAGNOSIS — G4700 Insomnia, unspecified: Secondary | ICD-10-CM

## 2019-01-11 NOTE — Chronic Care Management (AMB) (Signed)
  Chronic Care Management    Clinical Social Work General Note  01/11/2019 Name: Britanee Vanblarcom MRN: [id number] DOB: [birth year]  Gwyn Hieronymus is a 68 y.o. year old female who is a primary care patient of Volney American, [place]. The CCM was consulted to assist the patient with Mental Health Counseling and Resources.   Ms. Hewlett was given information about Chronic Care Management services today including:  1. CCM service includes personalized support from designated clinical staff supervised by her physician, including individualized plan of care and coordination with other care providers 2. 24/7 contact phone numbers for assistance for urgent and routine care needs. 3. Service will only be billed when office clinical staff spend 20 minutes or more in a month to coordinate care. 4. Only one practitioner may furnish and bill the service in a calendar month. 5. The patient may stop CCM services at any time (effective at the end of the month) by phone call to the office staff. 6. The patient will be responsible for cost sharing (co-pay) of up to 20% of the service fee (after annual deductible is met).  Patient agreed to services and verbal consent obtained.   Review of patient status, including review of consultants reports, relevant laboratory and other test results, and collaboration with appropriate care team members and the patient's provider was performed as part of comprehensive patient evaluation and provision of chronic care management services.    SDOH (Social Determinants of Health) screening performed today.   Goals Addressed            This Visit's Progress   . I want to continue to managing my anxiety/stress/depression well (pt-stated)       Current Barriers:  . Limited social support . Mental Health Concerns  . Social Isolation . Limited education about ways to improve self-care during COVID-19* . Lacks knowledge of community resource: available  mental health support resources within the area  Clinical Social Work Clinical Goal(s):  Marland Kitchen Over the next 90 days, client will work with SW to address concerns related to anxiety and depression management . Over the next 90 days, patient will work with LCSW to address needs related to increasing self-care  Interventions: . Patient interviewed and appropriate assessments performed . Provided mental health counseling with regard to anxiety (mental health diagnosis or concern) . Provided patient with information about what healthy self-care looks like . Educated patient on mental health support resources available within the community even during COVID-19. Patient denies wanting to pursue these resources at this time but is open to ongoing conversations with LCSW. Marland Kitchen Discussed plans with patient for ongoing care management follow up and provided patient with direct contact information for care management team . Advised patient to continue implementing healthy self-care activities into her daily routine such as walking her dog around the block, getting vitamin D, reading and focusing on self  . Assisted patient/caregiver with obtaining information about health plan benefits  Patient Self Care Activities:  . Attends all scheduled provider appointments . Calls provider office for new concerns or questions  Initial goal documentation   Follow Up Plan: SW will follow up with patient by phone over the next 30 days      Eula Fried, [place], MSW, [email or web address] Phone: [phone]

## 2019-01-14 ENCOUNTER — Telehealth: Payer: Self-pay | Admitting: Family Medicine

## 2019-01-14 NOTE — Telephone Encounter (Signed)
Called pt to schedule virtual visit, no answer, left voicemail.

## 2019-01-17 ENCOUNTER — Ambulatory Visit (INDEPENDENT_AMBULATORY_CARE_PROVIDER_SITE_OTHER): Payer: PPO | Admitting: Family Medicine

## 2019-01-17 ENCOUNTER — Encounter: Payer: Self-pay | Admitting: Family Medicine

## 2019-01-17 ENCOUNTER — Other Ambulatory Visit: Payer: Self-pay

## 2019-01-17 VITALS — Ht 63.0 in | Wt 125.0 lb

## 2019-01-17 DIAGNOSIS — F419 Anxiety disorder, unspecified: Secondary | ICD-10-CM | POA: Diagnosis not present

## 2019-01-17 DIAGNOSIS — G4700 Insomnia, unspecified: Secondary | ICD-10-CM

## 2019-01-17 DIAGNOSIS — F17210 Nicotine dependence, cigarettes, uncomplicated: Secondary | ICD-10-CM

## 2019-01-17 MED ORDER — ALPRAZOLAM 0.5 MG PO TABS: ORAL_TABLET | ORAL | 2 refills | Status: DC

## 2019-01-17 MED ORDER — AMITRIPTYLINE HCL 50 MG PO TABS: 100.0000 mg | ORAL_TABLET | Freq: Every day | ORAL | 3 refills | Status: DC

## 2019-01-17 MED ORDER — VARENICLINE TARTRATE 0.5 MG X 11 & 1 MG X 42 PO MISC: ORAL | 0 refills | Status: DC

## 2019-01-17 NOTE — Progress Notes (Signed)
Ht 5\' 3"  (1.6 m)   Wt 125 lb (56.7 kg)   BMI 22.14 kg/m    Subjective:    Patient ID: Jordan Cantu, female    DOB: [birth year], 68 y.o.   MRN: [id number]  HPI: Jordan Cantu is a 68 y.o. female  Chief Complaint  Patient presents with  . Anxiety    Follow-up, no complaints  . Nicotine Dependence    Would like to stop smoking and start chantix, but afraid of price.    . This visit was completed via WebEx due to the restrictions of the COVID-19 pandemic. All issues as above were discussed and addressed. Physical exam was done as above through visual confirmation on WebEx. If it was felt that the patient should be evaluated in the office, they were directed there. The patient verbally consented to this visit. . Location of the patient: home . Location of the provider: home . Those involved with this call:  . Provider: Merrie Roof, PA-C . CMA: Merilyn Baba, [place] . Front Desk/Registration: Jill Side  . Time spent on call: 25 minutes with patient face to face via video conference. More than 50% of this time was spent in counseling and coordination of care. 10 minutes total spent in review of patient's record and preparation of their chart. I verified patient identity using two factors (patient name and date of birth). Patient consents verbally to being seen via telemedicine visit today.   Here today for sleep and anxiety f/u. Doing very well on current regimen. No concerns. Taking 100 mg elavil at bedtime and TID xanax with good relief of her chronic anxiety sxs. Denies frequent panic episodes, SI/HI, daytime grogginess, N/V.   Restarted smoking. Quit with chantix in the past. Tried wellbutrin and nicorette without good relief. Wanting to restart chantix. Tolerated well last time she used it.   Depression screen [facility] 2/9 01/11/2019 01/10/2019 12/02/2018  Decreased Interest 0 0 2  Down, Depressed, Hopeless 0 0 1  PHQ - 2 Score 0 0 3  Altered sleeping - - 1  Tired,  decreased energy - - 2  Change in appetite - - 3  Feeling bad or failure about yourself  - - 0  Trouble concentrating - - 0  Moving slowly or fidgety/restless - - 0  Suicidal thoughts - - 0  PHQ-9 Score - - 9  Difficult doing work/chores - - -   GAD 7 : Generalized Anxiety Score 01/17/2019 12/02/2018 10/18/2018 07/19/2018  Nervous, Anxious, on Edge 0 0 3 0  Control/stop worrying 0 0 1 0  Worry too much - different things 0 0 1 0  Trouble relaxing 0 1 3 0  Restless 1 2 3  0  Easily annoyed or irritable 0 0 1 0  Afraid - awful might happen 0 2 1 0  Total GAD 7 Score 1 5 13  0  Anxiety Difficulty Not difficult at all - Somewhat difficult Not difficult at all     Relevant past medical, surgical, family and social history reviewed and updated as indicated. Interim medical history since our last visit reviewed. Allergies and medications reviewed and updated.  Review of Systems  Per HPI unless specifically indicated above     Objective:    Ht 5\' 3"  (1.6 m)   Wt 125 lb (56.7 kg)   BMI 22.14 kg/m   Wt Readings from Last 3 Encounters:  01/17/19 125 lb (56.7 kg)  01/10/19 125 lb 11.2 oz (57 kg)  11/24/18 120  lb (54.4 kg)    Physical Exam Vitals signs and nursing note reviewed.  Constitutional:      General: She is not in acute distress.    Appearance: Normal appearance.  HENT:     Head: Atraumatic.     Right Ear: External ear normal.     Left Ear: External ear normal.     Nose: Nose normal. No congestion.     Mouth/Throat:     Mouth: Mucous membranes are moist.     Pharynx: Oropharynx is clear. No posterior oropharyngeal erythema.  Eyes:     Extraocular Movements: Extraocular movements intact.     Conjunctiva/sclera: Conjunctivae normal.  Neck:     Musculoskeletal: Normal range of motion.  Cardiovascular:     Comments: Unable to assess via virtual visit Pulmonary:     Effort: Pulmonary effort is normal. No respiratory distress.  Musculoskeletal: Normal range of motion.   Skin:    General: Skin is dry.     Findings: No erythema.  Neurological:     Mental Status: She is alert and oriented to person, place, and time.  Psychiatric:        Mood and Affect: Mood normal.        Thought Content: Thought content normal.        Judgment: Judgment normal.     Results for orders placed or performed in visit on 07/19/18  Benzodiazepines Confirm, Urine  Result Value Ref Range   BENZODIAZEPINES Positive (A) Cutoff=100 ng/mL   NORDIAZEPAM Negative Cutoff=100   Oxazepam Negative Cutoff=100   Flurazepam Negative Cutoff=100   LORAZEPAM Negative Cutoff=100   ALPRAZOLAM Positive (A)    ALPRAZOLAM CONFIRM 733 Cutoff=100 ng/mL   CLONAZEPAM Negative Cutoff=100   TEMAZEPAM Negative Cutoff=100   TRIAZOLAM Negative Cutoff=100   MIDAZOLAM Negative Cutoff=100   Please Note: Comment       Assessment & Plan:   Problem List Items Addressed This Visit      Other   Anxiety    Stable on current regimen, under good control. Continue current regimen      Relevant Medications   ALPRAZolam (XANAX) 0.5 MG tablet   amitriptyline (ELAVIL) 50 MG tablet   Insomnia - Primary    Stable and under good control with elavil nightly. Continue current regimen      Cigarette smoker    Will restart chantix, work on habit substitution          Follow up plan: Return in about 3 months (around 04/19/2019) for anxiety, sleep f/u.

## 2019-01-20 DIAGNOSIS — Z87891 Personal history of nicotine dependence: Secondary | ICD-10-CM | POA: Insufficient documentation

## 2019-01-20 DIAGNOSIS — F17210 Nicotine dependence, cigarettes, uncomplicated: Secondary | ICD-10-CM | POA: Insufficient documentation

## 2019-01-20 NOTE — Assessment & Plan Note (Signed)
Will restart chantix, work on habit substitution

## 2019-01-20 NOTE — Assessment & Plan Note (Signed)
Stable on current regimen, under good control. Continue current regimen

## 2019-01-20 NOTE — Assessment & Plan Note (Signed)
Stable and under good control with elavil nightly. Continue current regimen

## 2019-02-01 ENCOUNTER — Ambulatory Visit: Payer: Self-pay | Admitting: Licensed Clinical Social Worker

## 2019-02-01 ENCOUNTER — Telehealth: Payer: Self-pay

## 2019-02-01 NOTE — Chronic Care Management (AMB) (Signed)
  Care Management Note   Jordan Cantu is a 68 y.o. year old female who is a primary care patient of Volney American, [place]. The CM team was consulted for assistance with chronic disease management and care coordination.   I reached out to [name] by phone today. LCSW completed CCM outreach attempt today but was unable to reach patient successfully. A HIPPA compliant voice message was left encouraging patient to return call once available. LCSW rescheduled CCM SW appointment as well.  Review of patient status, including review of consultants reports, relevant laboratory and other test results, and collaboration with appropriate care team members and the patient's provider was performed as part of comprehensive patient evaluation and provision of chronic care management services.   Follow Up Plan: The care management team will reach out to the patient again next month.   Eula Fried, BSW, MSW, [facility] Care Management [email or web address] Phone: [phone]

## 2019-02-17 ENCOUNTER — Other Ambulatory Visit: Payer: Self-pay | Admitting: Family Medicine

## 2019-02-17 NOTE — Telephone Encounter (Signed)
Requested Prescriptions  Pending Prescriptions Disp Refills  . amitriptyline (ELAVIL) 50 MG tablet [Pharmacy Med Name: AMITRIPTYLINE 50MG  TABLETS] 60 tablet 3    Sig: TAKE 2 TABLETS(100 MG) BY MOUTH AT BEDTIME     Psychiatry:  Antidepressants - Heterocyclics (TCAs) Passed - 02/17/2019  8:11 AM      Passed - Valid encounter within last 6 months    Recent Outpatient Visits          1 month ago Insomnia, unspecified type   [place], [place]   2 months ago Fever, unspecified fever cause   [place], Lilia Argue, [place]   2 months ago Other fatigue   [place], [place], [place]   4 months ago [place], [place], [place]   7 months ago [place], Lilia Argue, [place]      Future Appointments            In 2 months Orene Desanctis, Lilia Argue, [place], [place] - Completed PHQ-2 or PHQ-9 in the last 360 days.

## 2019-02-23 ENCOUNTER — Ambulatory Visit: Payer: PPO

## 2019-03-07 ENCOUNTER — Ambulatory Visit
Admission: RE | Admit: 2019-03-07 | Discharge: 2019-03-07 | Disposition: A | Discharge status: Home / Self Care | Payer: PPO | Source: Ambulatory Visit | Attending: Family Medicine | Admitting: Family Medicine

## 2019-03-07 ENCOUNTER — Other Ambulatory Visit: Payer: Self-pay

## 2019-03-07 DIAGNOSIS — Z1231 Encounter for screening mammogram for malignant neoplasm of breast: Secondary | ICD-10-CM | POA: Insufficient documentation

## 2019-03-07 DIAGNOSIS — M810 Age-related osteoporosis without current pathological fracture: Secondary | ICD-10-CM | POA: Insufficient documentation

## 2019-03-07 DIAGNOSIS — Z13820 Encounter for screening for osteoporosis: Secondary | ICD-10-CM | POA: Insufficient documentation

## 2019-03-07 DIAGNOSIS — M8588 Other specified disorders of bone density and structure, other site: Secondary | ICD-10-CM | POA: Diagnosis not present

## 2019-03-07 DIAGNOSIS — Z780 Asymptomatic menopausal state: Secondary | ICD-10-CM

## 2019-03-07 DIAGNOSIS — Z1239 Encounter for other screening for malignant neoplasm of breast: Secondary | ICD-10-CM

## 2019-03-11 ENCOUNTER — Telehealth: Payer: Self-pay

## 2019-03-11 ENCOUNTER — Ambulatory Visit: Payer: Self-pay | Admitting: Licensed Clinical Social Worker

## 2019-03-11 NOTE — Chronic Care Management (AMB) (Signed)
  Chronic Care Management    Clinical Social Work Follow Up Note  03/11/2019 Name: Jordan Cantu MRN: [id number] DOB: 1950/10/24  Jordan Cantu is a 68 y.o. year old female who is a primary care patient of Volney American, [place]. The CCM team was consulted for assistance with Mental Health Counseling and Resources.   Review of patient status, including review of consultants reports, other relevant assessments, and collaboration with appropriate care team members and the patient's provider was performed as part of comprehensive patient evaluation and provision of chronic care management services.     LCSW completed CCM outreach attempt today but was unable to reach patient successfully. A HIPPA compliant voice message was left encouraging patient to return call once available. LCSW rescheduled CCM SW appointment as well.  Follow Up Plan: SW will follow up with patient by phone over the next month  Eula Fried, [place], MSW, [email or web address] Phone: [phone]

## 2019-03-14 ENCOUNTER — Telehealth: Payer: Self-pay | Admitting: Family Medicine

## 2019-03-14 NOTE — Telephone Encounter (Signed)
Called and left VM to return call to the office regarding results of her DEXA scan

## 2019-03-22 ENCOUNTER — Encounter: Payer: Self-pay | Admitting: Family Medicine

## 2019-04-25 ENCOUNTER — Other Ambulatory Visit: Payer: Self-pay

## 2019-04-25 ENCOUNTER — Ambulatory Visit (INDEPENDENT_AMBULATORY_CARE_PROVIDER_SITE_OTHER): Payer: PPO | Admitting: Family Medicine

## 2019-04-25 ENCOUNTER — Encounter: Payer: Self-pay | Admitting: Family Medicine

## 2019-04-25 VITALS — BP 126/79 | Wt 123.0 lb

## 2019-04-25 DIAGNOSIS — G4700 Insomnia, unspecified: Secondary | ICD-10-CM | POA: Diagnosis not present

## 2019-04-25 DIAGNOSIS — F419 Anxiety disorder, unspecified: Secondary | ICD-10-CM

## 2019-04-25 DIAGNOSIS — F17210 Nicotine dependence, cigarettes, uncomplicated: Secondary | ICD-10-CM | POA: Diagnosis not present

## 2019-04-25 MED ORDER — VARENICLINE TARTRATE 1 MG PO TABS: 1.0000 mg | ORAL_TABLET | Freq: Two times a day (BID) | ORAL | 2 refills | Status: DC

## 2019-04-25 MED ORDER — ALPRAZOLAM 0.5 MG PO TABS: ORAL_TABLET | ORAL | 2 refills | Status: DC

## 2019-04-25 MED ORDER — AMITRIPTYLINE HCL 50 MG PO TABS: ORAL_TABLET | ORAL | 2 refills | Status: DC

## 2019-04-25 NOTE — Assessment & Plan Note (Signed)
Congratulated being 3 days cigarette free and encouraged continued cessation. Will continue at least one more month on chantix to solidify cessation.

## 2019-04-25 NOTE — Progress Notes (Signed)
BP 126/79   Wt 123 lb (55.8 kg)   BMI 21.79 kg/m    Subjective:    Patient ID: Jordan Cantu, female    DOB: 05-Mar-1951, 68 y.o.   MRN: [id number]  HPI: Jordan Cantu is a 68 y.o. female  Chief Complaint  Patient presents with  . Anxiety  . Nicotine Dependence    Patient has not smoked in 3 days    . This visit was completed via WebEx due to the restrictions of the COVID-19 pandemic. All issues as above were discussed and addressed. Physical exam was done as above through visual confirmation on WebEx. If it was felt that the patient should be evaluated in the office, they were directed there. The patient verbally consented to this visit. . Location of the patient: home . Location of the provider: home . Those involved with this call:  . Provider: Merrie Roof, PA-C . CMA: Tiffany Reel, CMA . Front Desk/Registration: Jill Side  . Time spent on call: 15 minutes with patient face to face via video conference. More than 50% of this time was spent in counseling and coordination of care. 5 minutes total spent in review of patient's record and preparation of their chart. I verified patient identity using two factors (patient name and date of birth). Patient consents verbally to being seen via telemedicine visit today.   Patient presenting today for anxiety, insomnia, and smoking cessation f/u.   Has been trying the chantix, off cigarettes for 3 days now. Tolerating the medicine well so far. Was only moderately motivated to quit up until last week when her sister was diagnosed with metastatic lung cancer, now determined to be done with the cigarettes. Denies SI/HI, vivid dreams, or other side effects.   Continues to do very well with her anxiety and insomnia on xanax TID prn and elavil regimen. No side effects, frequent panic episodes, SI/HI, drowsiness.   Depression screen [facility] 2/9 04/25/2019 01/11/2019 01/10/2019  Decreased Interest 0 0 0  Down, Depressed, Hopeless 0  0 0  PHQ - 2 Score 0 0 0  Altered sleeping 0 - -  Tired, decreased energy 0 - -  Change in appetite 0 - -  Feeling bad or failure about yourself  0 - -  Trouble concentrating 0 - -  Moving slowly or fidgety/restless 0 - -  Suicidal thoughts 0 - -  PHQ-9 Score 0 - -  Difficult doing work/chores Not difficult at all - -   GAD 7 : Generalized Anxiety Score 04/25/2019 01/17/2019 12/02/2018 10/18/2018  Nervous, Anxious, on Edge 0 0 0 3  Control/stop worrying 0 0 0 1  Worry too much - different things 1 0 0 1  Trouble relaxing 0 0 1 3  Restless 0 1 2 3   Easily annoyed or irritable 0 0 0 1  Afraid - awful might happen 0 0 2 1  Total GAD 7 Score 1 1 5 13   Anxiety Difficulty Not difficult at all Not difficult at all - Somewhat difficult     Relevant past medical, surgical, family and social history reviewed and updated as indicated. Interim medical history since our last visit reviewed. Allergies and medications reviewed and updated.  Review of Systems  Per HPI unless specifically indicated above     Objective:    BP 126/79   Wt 123 lb (55.8 kg)   BMI 21.79 kg/m   Wt Readings from Last 3 Encounters:  04/25/19 123 lb (55.8 kg)  01/17/19  125 lb (56.7 kg)  01/10/19 125 lb 11.2 oz (57 kg)    Physical Exam Vitals signs and nursing note reviewed.  Constitutional:      General: She is not in acute distress.    Appearance: Normal appearance.  HENT:     Head: Atraumatic.     Right Ear: External ear normal.     Left Ear: External ear normal.     Nose: Nose normal. No congestion.     Mouth/Throat:     Mouth: Mucous membranes are moist.     Pharynx: Oropharynx is clear. No posterior oropharyngeal erythema.  Eyes:     Extraocular Movements: Extraocular movements intact.     Conjunctiva/sclera: Conjunctivae normal.  Neck:     Musculoskeletal: Normal range of motion.  Cardiovascular:     Comments: Unable to assess via virtual visit Pulmonary:     Effort: Pulmonary effort is  normal. No respiratory distress.  Musculoskeletal: Normal range of motion.  Skin:    General: Skin is dry.     Findings: No erythema.  Neurological:     Mental Status: She is alert and oriented to person, place, and time.  Psychiatric:        Mood and Affect: Mood normal.        Thought Content: Thought content normal.        Judgment: Judgment normal.     Results for orders placed or performed in visit on 07/19/18  Benzodiazepines Confirm, Urine  Result Value Ref Range   BENZODIAZEPINES Positive (A) Cutoff=100 ng/mL   NORDIAZEPAM Negative Cutoff=100   Oxazepam Negative Cutoff=100   Flurazepam Negative Cutoff=100   LORAZEPAM Negative Cutoff=100   ALPRAZOLAM Positive (A)    ALPRAZOLAM CONFIRM 733 Cutoff=100 ng/mL   CLONAZEPAM Negative Cutoff=100   TEMAZEPAM Negative Cutoff=100   TRIAZOLAM Negative Cutoff=100   MIDAZOLAM Negative Cutoff=100   Please Note: Comment       Assessment & Plan:   Problem List Items Addressed This Visit      Other   Anxiety - Primary    Stable and under good control, continue current regimen      Relevant Medications   amitriptyline (ELAVIL) 50 MG tablet   ALPRAZolam (XANAX) 0.5 MG tablet   Insomnia    Stable and under good control on elavil, continue current regimen      Cigarette smoker    Congratulated being 3 days cigarette free and encouraged continued cessation. Will continue at least one more month on chantix to solidify cessation.           Follow up plan: Return in about 3 months (around 07/26/2019) for Anxiety, smoking cessation f/u.

## 2019-04-25 NOTE — Assessment & Plan Note (Signed)
Stable and under good control, continue current regimen 

## 2019-04-25 NOTE — Assessment & Plan Note (Signed)
Stable and under good control on elavil, continue current regimen

## 2019-04-29 ENCOUNTER — Telehealth: Payer: Self-pay

## 2019-04-29 ENCOUNTER — Ambulatory Visit: Payer: Self-pay | Admitting: Licensed Clinical Social Worker

## 2019-04-29 NOTE — Chronic Care Management (AMB) (Signed)
  Chronic Care Management    Clinical Social Work Follow Up Note  04/29/2019 Name: Jordan Cantu MRN: [id number] DOB: 1950/10/05  Jordan Cantu is a 68 y.o. year old female who is a primary care patient of Volney American, [place]. The CCM team was consulted for assistance with Mental Health Counseling and Resources.   Review of patient status, including review of consultants reports, other relevant assessments, and collaboration with appropriate care team members and the patient's provider was performed as part of comprehensive patient evaluation and provision of chronic care management services.    Outpatient Encounter Medications as of 04/29/2019  Medication Sig  . albuterol (PROVENTIL HFA;VENTOLIN HFA) 108 (90 Base) MCG/ACT inhaler INHALE 2 PUFFS INTO THE LUNGS EVERY 6 HOURS AS NEEDED FOR WHEEZING OR SHORTNESS OF BREATH  . ALPRAZolam (XANAX) 0.5 MG tablet TAKE 1 TABLET(0.5 MG) BY MOUTH THREE TIMES DAILY AS NEEDED FOR ANXIETY  . amitriptyline (ELAVIL) 50 MG tablet TAKE 2 TABLETS(100 MG) BY MOUTH AT BEDTIME  . varenicline (CHANTIX CONTINUING MONTH PAK) 1 MG tablet Take 1 tablet (1 mg total) by mouth 2 (two) times daily.  . varenicline (CHANTIX STARTING MONTH PAK) 0.5 MG X 11 & 1 MG X 42 tablet Take one 0.5 mg tablet by mouth once daily for 3 days, then increase to one 0.5 mg tablet twice daily for 4 days, then increase to one 1 mg tablet twice daily.   No facility-administered encounter medications on file as of 04/29/2019.    LCSW completed CCM outreach attempt today but was unable to reach patient successfully. A HIPPA compliant voice message was left encouraging patient to return call once available. LCSW rescheduled CCM SW appointment as well.  Follow Up Plan: SW will follow up with patient by phone over the next 60 days  Eula Fried, [place], MSW, [email or web address]  Phone: [phone]

## 2019-06-27 ENCOUNTER — Ambulatory Visit: Payer: Self-pay | Admitting: Licensed Clinical Social Worker

## 2019-06-27 ENCOUNTER — Telehealth: Payer: Self-pay

## 2019-06-27 NOTE — Chronic Care Management (AMB) (Signed)
  Care Management   Follow Up Note   06/27/2019 Name: Jordan Cantu MRN: [id number] DOB: 1951-05-21  Referred by: Volney American, PA-C Reason for referral : Peak is a 68 y.o. year old female who is a primary care patient of Volney American, [place]. The care management team was consulted for assistance with care management and care coordination needs.    Review of patient status, including review of consultants reports, relevant laboratory and other test results, and collaboration with appropriate care team members and the patient's provider was performed as part of comprehensive patient evaluation and provision of chronic care management services.    LCSW completed CCM outreach attempt today but was unable to reach patient successfully. A HIPPA compliant voice message was left encouraging patient to return call once available, if still interested in CCM social work involvement.   The care management team is available to follow up with the patient after provider conversation with the patient regarding recommendation for care management engagement and subsequent re-referral to the care management team.   Eula Fried, BSW, MSW, [email or web address] Phone: [phone]

## 2019-08-08 ENCOUNTER — Encounter: Payer: Self-pay | Admitting: Family Medicine

## 2019-08-08 ENCOUNTER — Ambulatory Visit (INDEPENDENT_AMBULATORY_CARE_PROVIDER_SITE_OTHER): Payer: PPO | Admitting: Family Medicine

## 2019-08-08 ENCOUNTER — Other Ambulatory Visit: Payer: Self-pay

## 2019-08-08 VITALS — BP 117/78 | HR 100 | Wt 127.0 lb

## 2019-08-08 DIAGNOSIS — G4700 Insomnia, unspecified: Secondary | ICD-10-CM

## 2019-08-08 DIAGNOSIS — F17210 Nicotine dependence, cigarettes, uncomplicated: Secondary | ICD-10-CM | POA: Diagnosis not present

## 2019-08-08 DIAGNOSIS — F419 Anxiety disorder, unspecified: Secondary | ICD-10-CM | POA: Diagnosis not present

## 2019-08-08 MED ORDER — AMITRIPTYLINE HCL 50 MG PO TABS: ORAL_TABLET | ORAL | 2 refills | Status: DC

## 2019-08-08 MED ORDER — ALPRAZOLAM 0.5 MG PO TABS: ORAL_TABLET | ORAL | 2 refills | Status: DC

## 2019-08-08 NOTE — Progress Notes (Signed)
BP 117/78   Pulse 100   Wt 127 lb (57.6 kg)   BMI 22.50 kg/m    Subjective:    Patient ID: Jordan Cantu, female    DOB: Jan 28, 1951, 68 y.o.   MRN: [id number]  HPI: [name] is a 68 y.o. female  Chief Complaint  Patient presents with  . Anxiety    . This visit was completed via WebEx due to the restrictions of the COVID-19 pandemic. All issues as above were discussed and addressed. Physical exam was done as above through visual confirmation on WebEx. If it was felt that the patient should be evaluated in the office, they were directed there. The patient verbally consented to this visit. . Location of the patient: home . Location of the provider: work . Those involved with this call:  . Provider: Merrie Roof, PA-C . CMA: Lesle Chris, [place] . Front Desk/Registration: Jill Side  . Time spent on call: 15 minutes with patient face to face via video conference. More than 50% of this time was spent in counseling and coordination of care. 5 minutes total spent in review of patient's record and preparation of their chart. I verified patient identity using two factors (patient name and date of birth). Patient consents verbally to being seen via telemedicine visit today.   Last cigarette was august 2020. Has done exceedingly well on the chantix and is planning to try one last month on it and then will stop and see how her cravings go.   Anxiety and insomnia continue to be well controlled with elavil and xanax regimen. COVID has exercerbated things significantly but she states she's staying home and doing the best she can to remain safe. Denies SI/HI.   Depression screen [facility] 2/9 08/08/2019 04/25/2019 01/11/2019  Decreased Interest 0 0 0  Down, Depressed, Hopeless 0 0 0  PHQ - 2 Score 0 0 0  Altered sleeping 1 0 -  Tired, decreased energy 0 0 -  Change in appetite 0 0 -  Feeling bad or failure about yourself  0 0 -  Trouble concentrating 0 0 -  Moving slowly or  fidgety/restless 0 0 -  Suicidal thoughts 0 0 -  PHQ-9 Score 1 0 -  Difficult doing work/chores Not difficult at all Not difficult at all -   GAD 7 : Generalized Anxiety Score 08/08/2019 04/25/2019 01/17/2019 12/02/2018  Nervous, Anxious, on Edge 0 0 0 0  Control/stop worrying 1 0 0 0  Worry too much - different things 1 1 0 0  Trouble relaxing 0 0 0 1  Restless 1 0 1 2  Easily annoyed or irritable 1 0 0 0  Afraid - awful might happen 1 0 0 2  Total GAD 7 Score 5 1 1 5   Anxiety Difficulty Not difficult at all Not difficult at all Not difficult at all -   Relevant past medical, surgical, family and social history reviewed and updated as indicated. Interim medical history since our last visit reviewed. Allergies and medications reviewed and updated.  Review of Systems  Per HPI unless specifically indicated above     Objective:    BP 117/78   Pulse 100   Wt 127 lb (57.6 kg)   BMI 22.50 kg/m   Wt Readings from Last 3 Encounters:  08/08/19 127 lb (57.6 kg)  04/25/19 123 lb (55.8 kg)  01/17/19 125 lb (56.7 kg)    Physical Exam Vitals and nursing note reviewed.  Constitutional:  General: She is not in acute distress.    Appearance: Normal appearance.  HENT:     Head: Atraumatic.     Right Ear: External ear normal.     Left Ear: External ear normal.     Nose: Nose normal. No congestion.     Mouth/Throat:     Mouth: Mucous membranes are moist.     Pharynx: Oropharynx is clear. No posterior oropharyngeal erythema.  Eyes:     Extraocular Movements: Extraocular movements intact.     Conjunctiva/sclera: Conjunctivae normal.  Cardiovascular:     Comments: Unable to assess via virtual visit Pulmonary:     Effort: Pulmonary effort is normal. No respiratory distress.  Musculoskeletal:        General: Normal range of motion.     Cervical back: Normal range of motion.  Skin:    General: Skin is dry.     Findings: No erythema.  Neurological:     Mental Status: She is alert  and oriented to person, place, and time.  Psychiatric:        Mood and Affect: Mood normal.        Thought Content: Thought content normal.        Judgment: Judgment normal.     Results for orders placed or performed in visit on 07/19/18  Benzodiazepines Confirm, Urine  Result Value Ref Range   BENZODIAZEPINES Positive (A) Cutoff=100 ng/mL   NORDIAZEPAM Negative Cutoff=100   Oxazepam Negative Cutoff=100   Flurazepam Negative Cutoff=100   LORAZEPAM Negative Cutoff=100   ALPRAZOLAM Positive (A)    ALPRAZOLAM CONFIRM 733 Cutoff=100 ng/mL   CLONAZEPAM Negative Cutoff=100   TEMAZEPAM Negative Cutoff=100   TRIAZOLAM Negative Cutoff=100   MIDAZOLAM Negative Cutoff=100   Please Note: Comment       Assessment & Plan:   Problem List Items Addressed This Visit      Other   Anxiety    Stable and under good control, continue current regimen      Relevant Medications   amitriptyline (ELAVIL) 50 MG tablet   ALPRAZolam (XANAX) 0.5 MG tablet   Insomnia    Stable and under good control, continue current regimen      Cigarette smoker - Primary    Congratulated success on quitting. Continue chantix until ready to come off          Follow up plan: Return in about 3 months (around 11/06/2019) for Anxiety f/u.

## 2019-08-11 NOTE — Assessment & Plan Note (Signed)
Stable and under good control, continue current regimen 

## 2019-08-11 NOTE — Assessment & Plan Note (Signed)
Congratulated success on quitting. Continue chantix until ready to come off

## 2019-08-29 ENCOUNTER — Telehealth: Payer: Self-pay | Admitting: Family Medicine

## 2019-08-29 ENCOUNTER — Encounter: Payer: Self-pay | Admitting: Family Medicine

## 2019-08-29 NOTE — Telephone Encounter (Signed)
Called pt to scheduled 3 month f/u per Apolonio Schneiders, no answer, left vm, sending letter.

## 2019-10-03 ENCOUNTER — Ambulatory Visit: Payer: PPO | Admitting: Family Medicine

## 2019-10-05 ENCOUNTER — Other Ambulatory Visit: Payer: Self-pay | Admitting: Family Medicine

## 2019-10-05 NOTE — Telephone Encounter (Signed)
Forwarding medication refill request to the PCP for review.

## 2019-11-11 ENCOUNTER — Ambulatory Visit (INDEPENDENT_AMBULATORY_CARE_PROVIDER_SITE_OTHER): Payer: PPO | Admitting: Family Medicine

## 2019-11-11 ENCOUNTER — Encounter: Payer: Self-pay | Admitting: Family Medicine

## 2019-11-11 ENCOUNTER — Other Ambulatory Visit: Payer: Self-pay

## 2019-11-11 VITALS — BP 114/72 | HR 75 | Temp 97.5°F | Ht 63.0 in | Wt 131.0 lb

## 2019-11-11 DIAGNOSIS — Z862 Personal history of diseases of the blood and blood-forming organs and certain disorders involving the immune mechanism: Secondary | ICD-10-CM | POA: Diagnosis not present

## 2019-11-11 DIAGNOSIS — R7309 Other abnormal glucose: Secondary | ICD-10-CM | POA: Diagnosis not present

## 2019-11-11 DIAGNOSIS — Z87891 Personal history of nicotine dependence: Secondary | ICD-10-CM | POA: Diagnosis not present

## 2019-11-11 DIAGNOSIS — F331 Major depressive disorder, recurrent, moderate: Secondary | ICD-10-CM | POA: Diagnosis not present

## 2019-11-11 DIAGNOSIS — F419 Anxiety disorder, unspecified: Secondary | ICD-10-CM

## 2019-11-11 DIAGNOSIS — G4700 Insomnia, unspecified: Secondary | ICD-10-CM

## 2019-11-11 DIAGNOSIS — E7800 Pure hypercholesterolemia, unspecified: Secondary | ICD-10-CM | POA: Diagnosis not present

## 2019-11-11 MED ORDER — AMITRIPTYLINE HCL 50 MG PO TABS: ORAL_TABLET | ORAL | 2 refills | Status: DC

## 2019-11-11 MED ORDER — ALPRAZOLAM 0.5 MG PO TABS: ORAL_TABLET | ORAL | 2 refills | Status: DC

## 2019-11-12 ENCOUNTER — Ambulatory Visit: Payer: PPO | Attending: Internal Medicine

## 2019-11-12 DIAGNOSIS — Z23 Encounter for immunization: Secondary | ICD-10-CM

## 2019-11-12 LAB — LIPID PANEL W/O CHOL/HDL RATIO
Cholesterol, Total: 212 mg/dL — ABNORMAL HIGH (ref 100–199)
HDL: 66 mg/dL (ref >39)
LDL Chol Calc (NIH): 126 mg/dL — ABNORMAL HIGH (ref 0–99)
Triglycerides: 115 mg/dL (ref 0–149)
VLDL Cholesterol Cal: 20 mg/dL (ref 5–40)

## 2019-11-12 LAB — COMPREHENSIVE METABOLIC PANEL
ALT: 12 IU/L (ref 0–32)
AST: 18 IU/L (ref 0–40)
Albumin/Globulin Ratio: 1.5 (ref 1.2–2.2)
Albumin: 4 g/dL (ref 3.8–4.8)
Alkaline Phosphatase: 89 IU/L (ref 39–117)
BUN/Creatinine Ratio: 7 — ABNORMAL LOW (ref 12–28)
BUN: 8 mg/dL (ref 8–27)
Bilirubin Total: 0.3 mg/dL (ref 0.0–1.2)
CO2: 24 mmol/L (ref 20–29)
Calcium: 9.2 mg/dL (ref 8.7–10.3)
Chloride: 99 mmol/L (ref 96–106)
Creatinine, Ser: 1.23 mg/dL — ABNORMAL HIGH (ref 0.57–1.00)
GFR calc Af Amer: 52 mL/min/{1.73_m2} — ABNORMAL LOW (ref >59)
GFR calc non Af Amer: 45 mL/min/{1.73_m2} — ABNORMAL LOW (ref >59)
Globulin, Total: 2.6 g/dL (ref 1.5–4.5)
Glucose: 68 mg/dL (ref 65–99)
Potassium: 4.5 mmol/L (ref 3.5–5.2)
Sodium: 138 mmol/L (ref 134–144)
Total Protein: 6.6 g/dL (ref 6.0–8.5)

## 2019-11-12 LAB — CBC WITH DIFFERENTIAL/PLATELET
Basophils Absolute: 0.1 10*3/uL (ref 0.0–0.2)
Basos: 1 %
EOS (ABSOLUTE): 0.3 10*3/uL (ref 0.0–0.4)
Eos: 4 %
Hematocrit: 41.8 % (ref 34.0–46.6)
Hemoglobin: 13.9 g/dL (ref 11.1–15.9)
Immature Grans (Abs): 0 10*3/uL (ref 0.0–0.1)
Immature Granulocytes: 0 %
Lymphocytes Absolute: 2.1 10*3/uL (ref 0.7–3.1)
Lymphs: 38 %
MCH: 32.8 pg (ref 26.6–33.0)
MCHC: 33.3 g/dL (ref 31.5–35.7)
MCV: 99 fL — ABNORMAL HIGH (ref 79–97)
Monocytes Absolute: 0.5 10*3/uL (ref 0.1–0.9)
Monocytes: 9 %
Neutrophils Absolute: 2.7 10*3/uL (ref 1.4–7.0)
Neutrophils: 48 %
Platelets: 228 10*3/uL (ref 150–450)
RBC: 4.24 x10E6/uL (ref 3.77–5.28)
RDW: 12.2 % (ref 11.7–15.4)
WBC: 5.7 10*3/uL (ref 3.4–10.8)

## 2019-11-12 NOTE — Progress Notes (Signed)
   Covid-19 Vaccination Clinic  Name:  Jordan Cantu    MRN: [id number] DOB: [birth year]  11/12/2019  [name] was observed post Covid-19 immunization for 15 minutes without incident. She was provided with Vaccine Information Sheet and instruction to access the V-Safe system.   [name] was instructed to call 911 with any severe reactions post vaccine: Marland Kitchen Difficulty breathing  . Swelling of face and throat  . A fast heartbeat  . A bad rash all over body  . Dizziness and weakness   Immunizations Administered    Name Date Dose VIS Date Route   Pfizer COVID-19 Vaccine 11/12/2019 11:54 AM 0.3 mL 08/12/2019 Intramuscular   Manufacturer: [place]   Lot: [id number]   [place]: [id number]

## 2019-11-13 NOTE — Assessment & Plan Note (Signed)
Stable and well controlled, continue current regimen 

## 2019-11-13 NOTE — Progress Notes (Signed)
BP 114/72   Pulse 75   Temp (!) 97.5 F (36.4 C) (Oral)   Ht 5\' 3"  (1.6 m)   Wt 131 lb (59.4 kg)   SpO2 99%   BMI 23.21 kg/m    Subjective:    Patient ID: Jordan Cantu, female    DOB: Jul 14, 1951, 69 y.o.   MRN: [id number]  HPI: Jordan Cantu is a 69 y.o. female  Chief Complaint  Patient presents with  . Anxiety   Patient presenting today for 3 month f/u.   Continues to do well on current regimen with sleep and anxiety management. Does take the xanax consistently 3 times daily, has been on this regimen without escalation for years now. Elavil helps significantly with sleep. Denies side effects or new concerns there. Denies mood concerns, SI/HI.   Has not had basic labs since 2019. Per record review, has a hx of mild anemia, elevated glucose, and elevated cholesterol. Tries to eat well and stay active, not having chest pain, claudication, SOB, hyper or hypoglycemic sxs.   Stopped smoking Aug 25th 2020 after sister was dx'd with lung cancer, plans to never restart. Took chantix to help quit.   Depression screen [facility] 2/9 11/11/2019 08/08/2019 04/25/2019  Decreased Interest 1 0 0  Down, Depressed, Hopeless 1 0 0  PHQ - 2 Score 2 0 0  Altered sleeping 1 1 0  Tired, decreased energy 0 0 0  Change in appetite 0 0 0  Feeling bad or failure about yourself  0 0 0  Trouble concentrating 1 0 0  Moving slowly or fidgety/restless 0 0 0  Suicidal thoughts 0 0 0  PHQ-9 Score 4 1 0  Difficult doing work/chores - Not difficult at all Not difficult at all   GAD 7 : Generalized Anxiety Score 11/11/2019 08/08/2019 04/25/2019 01/17/2019  Nervous, Anxious, on Edge 1 0 0 0  Control/stop worrying 1 1 0 0  Worry too much - different things 1 1 1  0  Trouble relaxing 1 0 0 0  Restless 1 1 0 1  Easily annoyed or irritable 0 1 0 0  Afraid - awful might happen 1 1 0 0  Total GAD 7 Score 6 5 1 1   Anxiety Difficulty - Not difficult at all Not difficult at all Not difficult at all    Relevant past medical, surgical, family and social history reviewed and updated as indicated. Interim medical history since our last visit reviewed. Allergies and medications reviewed and updated.  Review of Systems  Per HPI unless specifically indicated above     Objective:    BP 114/72   Pulse 75   Temp (!) 97.5 F (36.4 C) (Oral)   Ht 5\' 3"  (1.6 m)   Wt 131 lb (59.4 kg)   SpO2 99%   BMI 23.21 kg/m   Wt Readings from Last 3 Encounters:  11/11/19 131 lb (59.4 kg)  08/08/19 127 lb (57.6 kg)  04/25/19 123 lb (55.8 kg)    Physical Exam Vitals and nursing note reviewed.  Constitutional:      Appearance: Normal appearance. She is not ill-appearing.  HENT:     Head: Atraumatic.  Eyes:     Extraocular Movements: Extraocular movements intact.     Conjunctiva/sclera: Conjunctivae normal.  Cardiovascular:     Rate and Rhythm: Normal rate and regular rhythm.     Heart sounds: Normal heart sounds.  Pulmonary:     Effort: Pulmonary effort is normal.  Breath sounds: Normal breath sounds.  Abdominal:     General: Bowel sounds are normal.     Palpations: Abdomen is soft.  Musculoskeletal:        General: Normal range of motion.     Cervical back: Normal range of motion and neck supple.  Skin:    General: Skin is warm and dry.  Neurological:     Mental Status: She is alert and oriented to person, place, and time.  Psychiatric:        Mood and Affect: Mood normal.        Thought Content: Thought content normal.        Judgment: Judgment normal.     Results for orders placed or performed in visit on 11/11/19  CBC with Differential/Platelet  Result Value Ref Range   WBC 5.7 3.4 - 10.8 x10E3/uL   RBC 4.24 3.77 - 5.28 x10E6/uL   Hemoglobin 13.9 11.1 - 15.9 g/dL   Hematocrit 41.8 34.0 - 46.6 %   MCV 99 (H) 79 - 97 fL   MCH 32.8 26.6 - 33.0 pg   MCHC 33.3 31.5 - 35.7 g/dL   RDW 12.2 11.7 - 15.4 %   Platelets 228 150 - 450 x10E3/uL   Neutrophils 48 Not Estab. %    Lymphs 38 Not Estab. %   Monocytes 9 Not Estab. %   Eos 4 Not Estab. %   Basos 1 Not Estab. %   Neutrophils Absolute 2.7 1.4 - 7.0 x10E3/uL   Lymphocytes Absolute 2.1 0.7 - 3.1 x10E3/uL   Monocytes Absolute 0.5 0.1 - 0.9 x10E3/uL   EOS (ABSOLUTE) 0.3 0.0 - 0.4 x10E3/uL   Basophils Absolute 0.1 0.0 - 0.2 x10E3/uL   Immature Granulocytes 0 Not Estab. %   Immature Grans (Abs) 0.0 0.0 - 0.1 x10E3/uL  Comprehensive metabolic panel  Result Value Ref Range   Glucose 68 65 - 99 mg/dL   BUN 8 8 - 27 mg/dL   Creatinine, Ser 1.23 (H) 0.57 - 1.00 mg/dL   GFR calc non Af Amer 45 (L) >59 mL/min/1.73   GFR calc Af Amer 52 (L) >59 mL/min/1.73   BUN/Creatinine Ratio 7 (L) 12 - 28   Sodium 138 134 - 144 mmol/L   Potassium 4.5 3.5 - 5.2 mmol/L   Chloride 99 96 - 106 mmol/L   CO2 24 20 - 29 mmol/L   Calcium 9.2 8.7 - 10.3 mg/dL   Total Protein 6.6 6.0 - 8.5 g/dL   Albumin 4.0 3.8 - 4.8 g/dL   Globulin, Total 2.6 1.5 - 4.5 g/dL   Albumin/Globulin Ratio 1.5 1.2 - 2.2   Bilirubin Total 0.3 0.0 - 1.2 mg/dL   Alkaline Phosphatase 89 39 - 117 IU/L   AST 18 0 - 40 IU/L   ALT 12 0 - 32 IU/L  Lipid Panel w/o Chol/HDL Ratio  Result Value Ref Range   Cholesterol, Total 212 (H) 100 - 199 mg/dL   Triglycerides 115 0 - 149 mg/dL   HDL 66 >39 mg/dL   VLDL Cholesterol Cal 20 5 - 40 mg/dL   LDL Chol Calc (NIH) 126 (H) 0 - 99 mg/dL      Assessment & Plan:   Problem List Items Addressed This Visit      Other   Anxiety - Primary    Stable and well controlled, continue current regimen with close monitoring      Relevant Medications   amitriptyline (ELAVIL) 50 MG tablet   ALPRAZolam (XANAX) 0.5  MG tablet   Depression    Stable and well controlled, continue current regimen      Relevant Medications   amitriptyline (ELAVIL) 50 MG tablet   ALPRAZolam (XANAX) 0.5 MG tablet   Insomnia    Stable and well controlled, continue current regimen      Former cigarette smoker    Successfully quit Aug 2020,  congratulated success and continued cessation       Other Visit Diagnoses    History of anemia       Will check basic labs today, continue good diet and exercise. Recommended multivitamin regimen   Relevant Orders   CBC with Differential/Platelet (Completed)   Elevated glucose       Recheck labs, continue watching diet and staying active.Adjust if needed   Relevant Orders   Comprehensive metabolic panel (Completed)   Pure hypercholesterolemia       Recheck lipids, adjust as needed.    Relevant Orders   Lipid Panel w/o Chol/HDL Ratio (Completed)       Follow up plan: Return in about 3 months (around 02/11/2020) for anxiety, sleep f/u.

## 2019-11-13 NOTE — Assessment & Plan Note (Signed)
Successfully quit Aug 2020, congratulated success and continued cessation

## 2019-11-13 NOTE — Assessment & Plan Note (Signed)
Stable and well controlled, continue current regimen with close monitoring

## 2019-12-07 ENCOUNTER — Ambulatory Visit: Payer: PPO | Attending: Internal Medicine

## 2019-12-07 DIAGNOSIS — Z23 Encounter for immunization: Secondary | ICD-10-CM

## 2019-12-07 NOTE — Progress Notes (Signed)
   Covid-19 Vaccination Clinic  Name:  Jordan Cantu    MRN: [id number] DOB: October 31, 1950  12/07/2019  [name] was observed post Covid-19 immunization for 15 minutes without incident. She was provided with Vaccine Information Sheet and instruction to access the V-Safe system.   [name] was instructed to call 911 with any severe reactions post vaccine: Marland Kitchen Difficulty breathing  . Swelling of face and throat  . A fast heartbeat  . A bad rash all over body  . Dizziness and weakness   Immunizations Administered    Name Date Dose VIS Date Route   Pfizer COVID-19 Vaccine 12/07/2019  1:56 PM 0.3 mL 08/12/2019 Intramuscular   Manufacturer: [place]   Lot: [id number]   [place]: [id number]

## 2020-02-13 ENCOUNTER — Ambulatory Visit (INDEPENDENT_AMBULATORY_CARE_PROVIDER_SITE_OTHER): Payer: PPO | Admitting: Family Medicine

## 2020-02-13 ENCOUNTER — Other Ambulatory Visit: Payer: Self-pay

## 2020-02-13 ENCOUNTER — Encounter: Payer: Self-pay | Admitting: Family Medicine

## 2020-02-13 VITALS — BP 132/79 | HR 99 | Temp 97.5°F | Wt 128.0 lb

## 2020-02-13 DIAGNOSIS — G4700 Insomnia, unspecified: Secondary | ICD-10-CM

## 2020-02-13 DIAGNOSIS — F419 Anxiety disorder, unspecified: Secondary | ICD-10-CM | POA: Diagnosis not present

## 2020-02-13 DIAGNOSIS — Z23 Encounter for immunization: Secondary | ICD-10-CM | POA: Diagnosis not present

## 2020-02-13 DIAGNOSIS — J439 Emphysema, unspecified: Secondary | ICD-10-CM

## 2020-02-13 DIAGNOSIS — F331 Major depressive disorder, recurrent, moderate: Secondary | ICD-10-CM

## 2020-02-13 MED ORDER — ALBUTEROL SULFATE HFA 108 (90 BASE) MCG/ACT IN AERS: INHALATION_SPRAY | RESPIRATORY_TRACT | 3 refills | Status: DC

## 2020-02-13 MED ORDER — ALPRAZOLAM 0.5 MG PO TABS: ORAL_TABLET | ORAL | 2 refills | Status: DC

## 2020-02-13 MED ORDER — AMITRIPTYLINE HCL 50 MG PO TABS: ORAL_TABLET | ORAL | 2 refills | Status: DC

## 2020-02-13 NOTE — Assessment & Plan Note (Signed)
Stable on amitriptyline and alprazolam. Continue current regimen. Refills for 3 months given today. Follow up in 3 months.

## 2020-02-13 NOTE — Progress Notes (Signed)
BP 132/79   Pulse 99   Temp (!) 97.5 F (36.4 C) (Oral)   Wt 128 lb (58.1 kg)   SpO2 99%   BMI 22.67 kg/m    Subjective:    Patient ID: Jordan Cantu, female    DOB: [birth year], 69 y.o.   MRN: [id number]  HPI: Jordan Cantu is a 69 y.o. female  Chief Complaint  Patient presents with  . Anxiety  . Insomnia   ANXIETY/STRESS Duration: Chronic Stable:stable Anxious mood: yes  Excessive worrying: yes Irritability: no  Sweating: no Nausea: no Palpitations:no Hyperventilation: no Panic attacks: no Agoraphobia: no  Obscessions/compulsions: no Depressed mood: no Depression screen [facility] 2/9 02/13/2020 11/11/2019 08/08/2019 04/25/2019 01/11/2019  Decreased Interest 1 1 0 0 0  Down, Depressed, Hopeless 0 1 0 0 0  PHQ - 2 Score 1 2 0 0 0  Altered sleeping 1 1 1  0 -  Tired, decreased energy 1 0 0 0 -  Change in appetite 1 0 0 0 -  Feeling bad or failure about yourself  0 0 0 0 -  Trouble concentrating 1 1 0 0 -  Moving slowly or fidgety/restless 0 0 0 0 -  Suicidal thoughts 0 0 0 0 -  PHQ-9 Score 5 4 1  0 -  Difficult doing work/chores Somewhat difficult - Not difficult at all Not difficult at all -  Some recent data might be hidden   GAD 7 : Generalized Anxiety Score 02/13/2020 11/11/2019 08/08/2019 04/25/2019  Nervous, Anxious, on Edge 1 1 0 0  Control/stop worrying 1 1 1  0  Worry too much - different things 1 1 1 1   Trouble relaxing 1 1 0 0  Restless 0 1 1 0  Easily annoyed or irritable 1 0 1 0  Afraid - awful might happen 2 1 1  0  Total GAD 7 Score 7 6 5 1   Anxiety Difficulty Not difficult at all - Not difficult at all Not difficult at all   Anhedonia: no Weight changes: no Insomnia: yes   Hypersomnia: no Fatigue/loss of energy: yes Feelings of worthlessness: no Feelings of guilt: no Impaired concentration/indecisiveness: no Suicidal ideations: no  Crying spells: no Recent Stressors/Life Changes: no   Relationship problems: no   Family stress: yes      Financial stress: no    Job stress: no    Recent death/loss: no   Relevant past medical, surgical, family and social history reviewed and updated as indicated. Interim medical history since our last visit reviewed. Allergies and medications reviewed and updated.  Review of Systems  Constitutional: Negative.   Respiratory: Negative.   Cardiovascular: Negative.   Musculoskeletal: Negative.   Skin: Negative.   Neurological: Negative.   Psychiatric/Behavioral: Positive for dysphoric mood and sleep disturbance. Negative for agitation, behavioral problems, confusion, decreased concentration, hallucinations, self-injury and suicidal ideas. The patient is nervous/anxious. The patient is not hyperactive.     Per HPI unless specifically indicated above     Objective:    BP 132/79   Pulse 99   Temp (!) 97.5 F (36.4 C) (Oral)   Wt 128 lb (58.1 kg)   SpO2 99%   BMI 22.67 kg/m   Wt Readings from Last 3 Encounters:  02/13/20 128 lb (58.1 kg)  11/11/19 131 lb (59.4 kg)  08/08/19 127 lb (57.6 kg)    Physical Exam Vitals and nursing note reviewed.  Constitutional:      General: She is not in acute distress.  Appearance: Normal appearance. She is not ill-appearing, toxic-appearing or diaphoretic.  HENT:     Head: Normocephalic and atraumatic.     Right Ear: External ear normal.     Left Ear: External ear normal.     Nose: Nose normal.     Mouth/Throat:     Mouth: Mucous membranes are moist.     Pharynx: Oropharynx is clear.  Eyes:     General: No scleral icterus.       Right eye: No discharge.        Left eye: No discharge.     Extraocular Movements: Extraocular movements intact.     Conjunctiva/sclera: Conjunctivae normal.     Pupils: Pupils are equal, round, and reactive to light.  Cardiovascular:     Rate and Rhythm: Normal rate and regular rhythm.     Pulses: Normal pulses.     Heart sounds: Normal heart sounds. No murmur heard.  No friction rub. No gallop.     Pulmonary:     Effort: Pulmonary effort is normal. No respiratory distress.     Breath sounds: Normal breath sounds. No stridor. No wheezing, rhonchi or rales.  Chest:     Chest wall: No tenderness.  Musculoskeletal:        General: Normal range of motion.     Cervical back: Normal range of motion and neck supple.  Skin:    General: Skin is warm and dry.     Capillary Refill: Capillary refill takes less than 2 seconds.     Coloration: Skin is not jaundiced or pale.     Findings: No bruising, erythema, lesion or rash.  Neurological:     General: No focal deficit present.     Mental Status: She is alert and oriented to person, place, and time. Mental status is at baseline.  Psychiatric:        Mood and Affect: Mood normal.        Behavior: Behavior normal.        Thought Content: Thought content normal.        Judgment: Judgment normal.     Results for orders placed or performed in visit on 11/11/19  CBC with Differential/Platelet  Result Value Ref Range   WBC 5.7 3.4 - 10.8 x10E3/uL   RBC 4.24 3.77 - 5.28 x10E6/uL   Hemoglobin 13.9 11.1 - 15.9 g/dL   Hematocrit 41.8 34.0 - 46.6 %   MCV 99 (H) 79 - 97 fL   MCH 32.8 26.6 - 33.0 pg   MCHC 33.3 31 - 35 g/dL   RDW 12.2 11.7 - 15.4 %   Platelets 228 150 - 450 x10E3/uL   Neutrophils 48 Not Estab. %   Lymphs 38 Not Estab. %   Monocytes 9 Not Estab. %   Eos 4 Not Estab. %   Basos 1 Not Estab. %   Neutrophils Absolute 2.7 1 - 7 x10E3/uL   Lymphocytes Absolute 2.1 0 - 3 x10E3/uL   Monocytes Absolute 0.5 0 - 0 x10E3/uL   EOS (ABSOLUTE) 0.3 0.0 - 0.4 x10E3/uL   Basophils Absolute 0.1 0 - 0 x10E3/uL   Immature Granulocytes 0 Not Estab. %   Immature Grans (Abs) 0.0 0.0 - 0.1 x10E3/uL  Comprehensive metabolic panel  Result Value Ref Range   Glucose 68 65 - 99 mg/dL   BUN 8 8 - 27 mg/dL   Creatinine, Ser 1.23 (H) 0.57 - 1.00 mg/dL   GFR calc non Af Amer 45 (L) >59 mL/min/1.73  GFR calc Af Amer 52 (L) >59 mL/min/1.73    BUN/Creatinine Ratio 7 (L) 12 - 28   Sodium 138 134 - 144 mmol/L   Potassium 4.5 3.5 - 5.2 mmol/L   Chloride 99 96 - 106 mmol/L   CO2 24 20 - 29 mmol/L   Calcium 9.2 8.7 - 10.3 mg/dL   Total Protein 6.6 6.0 - 8.5 g/dL   Albumin 4.0 3.8 - 4.8 g/dL   Globulin, Total 2.6 1.5 - 4.5 g/dL   Albumin/Globulin Ratio 1.5 1.2 - 2.2   Bilirubin Total 0.3 0.0 - 1.2 mg/dL   Alkaline Phosphatase 89 39 - 117 IU/L   AST 18 0 - 40 IU/L   ALT 12 0 - 32 IU/L  Lipid Panel w/o Chol/HDL Ratio  Result Value Ref Range   Cholesterol, Total 212 (H) 100 - 199 mg/dL   Triglycerides 115 0 - 149 mg/dL   HDL 66 >39 mg/dL   VLDL Cholesterol Cal 20 5 - 40 mg/dL   LDL Chol Calc (NIH) 126 (H) 0 - 99 mg/dL      Assessment & Plan:   Problem List Items Addressed This Visit      Respiratory   Emphysema, unspecified ([place])    Due for refill on her albuterol. Refills given today.      Relevant Medications   albuterol (PROAIR HFA) 108 (90 Base) MCG/ACT inhaler     Other   Anxiety - Primary    Stable on amitriptyline and alprazolam. Continue current regimen. Refills for 3 months given today. Follow up in 3 months.       Relevant Medications   amitriptyline (ELAVIL) 50 MG tablet   ALPRAZolam (XANAX) 0.5 MG tablet   Depression    Stable on amitriptyline and alprazolam. Continue current regimen. Refills for 3 months given today. Follow up in 3 months.       Relevant Medications   amitriptyline (ELAVIL) 50 MG tablet   ALPRAZolam (XANAX) 0.5 MG tablet   Insomnia    Stable on amitriptyline. Continue current regimen. Refills for 3 months given today. Follow up in 3 months.        Other Visit Diagnoses    Need for pneumococcal vaccine       Prevnar given today.   Relevant Orders   Pneumococcal conjugate vaccine 13-valent IM (Completed)       Follow up plan: Return in about 3 months (around 05/15/2020), or Physical with Apolonio Schneiders.

## 2020-02-13 NOTE — Assessment & Plan Note (Signed)
Stable on amitriptyline. Continue current regimen. Refills for 3 months given today. Follow up in 3 months.

## 2020-02-13 NOTE — Assessment & Plan Note (Signed)
Due for refill on her albuterol. Refills given today.

## 2020-02-15 ENCOUNTER — Ambulatory Visit (INDEPENDENT_AMBULATORY_CARE_PROVIDER_SITE_OTHER): Payer: PPO

## 2020-02-15 VITALS — Ht 63.4 in | Wt 128.0 lb

## 2020-02-15 DIAGNOSIS — Z0000 Encounter for general adult medical examination without abnormal findings: Secondary | ICD-10-CM | POA: Diagnosis not present

## 2020-02-15 NOTE — Progress Notes (Signed)
Subjective:   Jordan Cantu is a 69 y.o. female who presents for Medicare Annual (Subsequent) preventive examination.  I connected with Lailoni Baquera today by telephone and verified that I am speaking with the correct person using two identifiers. Location patient: home Location provider: work Persons participating in the virtual visit: patient, Marine scientist.    I discussed the limitations, risks, security and privacy concerns of performing an evaluation and management service by telephone and the availability of in person appointments. I also discussed with the patient that there may be a patient responsible charge related to this service. The patient expressed understanding and verbally consented to this telephonic visit.    Interactive audio and video telecommunications were attempted between this provider and patient, however failed, due to patient having technical difficulties OR patient did not have access to video capability.  We continued and completed visit with audio only.  Some vital signs may be absent or patient reported.   Time Spent with patient on telephone encounter: 20 minutes.   Review of Systems:   Cardiac Risk Factors include: advanced age (>22men, >24 women);dyslipidemia;hypertension     Objective:     Vitals: Ht 5' 3.4" (1.61 m)   Wt 128 lb (58.1 kg)   BMI 22.39 kg/m   Body mass index is 22.39 kg/m.   Advanced Directives 02/15/2020 01/10/2019 03/11/2018 03/08/2018 12/28/2017  Does Patient Have a Medical Advance Directive? No No No No Yes  Does patient want to make changes to medical advance directive? - - - - Yes (MAU/Ambulatory/Procedural Areas - Information given)  Would patient like information on creating a medical advance directive? Yes (MAU/Ambulatory/Procedural Areas - Information given) Yes (MAU/Ambulatory/Procedural Areas - Information given) No - Patient declined Yes (MAU/Ambulatory/Procedural Areas - Information given) -    Tobacco Social History     Tobacco Use  Smoking Status Former Smoker  . Packs/day: 1.00  . Years: 50.00  . Pack years: 50.00  . Types: Cigarettes  Smokeless Tobacco Never Used  Tobacco Comment   pt states she stopped smoking 5 months ago/ Aug     Counseling given: Not Answered Comment: pt states she stopped smoking 5 months ago/ Aug   Clinical Intake:  Pre-visit preparation completed: Yes  Pain : No/denies pain     Nutritional Status: BMI of 19-24  Normal Nutritional Risks: None Diabetes: No  How often do you need to have someone help you when you read instructions, pamphlets, or other written materials from your doctor or pharmacy?: 1 - Never  Interpreter Needed?: No  Information entered by :: Nidia Grogan,LPN  Past Medical History:  Diagnosis Date  . Anxiety   . Depression   . Emphysema, unspecified ([place])   . Insomnia   . Tobacco use 12/28/2017  . Wears contact lenses   . Wears dentures    full upper   Past Surgical History:  Procedure Laterality Date  . COLONOSCOPY WITH PROPOFOL N/A 03/08/2018   Procedure: COLONOSCOPY WITH PROPOFOL;  Surgeon: Lucilla Lame, MD;  Location: [place];  Service: Endoscopy;  Laterality: N/A;  . NOSE SURGERY  2016   cauterization  . POLYPECTOMY N/A 03/08/2018   Procedure: POLYPECTOMY;  Surgeon: Lucilla Lame, MD;  Location: [place];  Service: Endoscopy;  Laterality: N/A;  . TOOTH EXTRACTION    . WISDOM TOOTH EXTRACTION     Family History  Problem Relation Age of Onset  . Pulmonary fibrosis Mother   . Depression Mother   . Diabetes Father   .  Lung disease Father   . Coronary artery disease Maternal Grandfather   . Diabetes Maternal Grandfather   . Breast cancer Maternal Aunt   . Breast cancer Maternal Aunt   . Lung cancer Maternal Uncle   . Lung cancer Paternal Aunt   . Breast cancer Cousin        mat cousin  . Cancer Sister        lung   Social History   Socioeconomic History  . Marital status: Legally Separated     Spouse name: Not on file  . Number of children: Not on file  . Years of education: Not on file  . Highest education level: High school graduate  Occupational History  . Occupation: retired  Tobacco Use  . Smoking status: Former Smoker    Packs/day: 1.00    Years: 50.00    Pack years: 50.00    Types: Cigarettes  . Smokeless tobacco: Never Used  . Tobacco comment: pt states she stopped smoking 5 months ago/ Aug  Vaping Use  . Vaping Use: Never used  Substance and Sexual Activity  . Alcohol use: Yes    Alcohol/week: 3.0 standard drinks    Types: 3 Cans of beer per week    Comment:    . Drug use: Never  . Sexual activity: Not Currently  Other Topics Concern  . Not on file  Social History Narrative   parttime caregiver for 69 year old   Social Determinants of Health   Financial Resource Strain:   . Difficulty of Paying Living Expenses:   Food Insecurity:   . Worried About Charity fundraiser in the Last Year:   . Arboriculturist in the Last Year:   Transportation Needs:   . Film/video editor (Medical):   Marland Kitchen Lack of Transportation (Non-Medical):   Physical Activity:   . Days of Exercise per Week:   . Minutes of Exercise per Session:   Stress:   . Feeling of Stress :   Social Connections:   . Frequency of Communication with Friends and Family:   . Frequency of Social Gatherings with Friends and Family:   . Attends Religious Services:   . Active Member of Clubs or Organizations:   . Attends Archivist Meetings:   Marland Kitchen Marital Status:     Outpatient Encounter Medications as of 02/15/2020  Medication Sig  . albuterol (PROAIR HFA) 108 (90 Base) MCG/ACT inhaler INHALE 2 PUFFS INTO THE LUNGS EVERY 6 HOURS AS NEEDED FOR WHEEZING OR SHORTNESS OF BREATH  . ALPRAZolam (XANAX) 0.5 MG tablet TAKE 1 TABLET(0.5 MG) BY MOUTH THREE TIMES DAILY AS NEEDED FOR ANXIETY  . amitriptyline (ELAVIL) 50 MG tablet TAKE 2 TABLETS(100 MG) BY MOUTH AT BEDTIME   No facility-administered  encounter medications on file as of 02/15/2020.    Activities of Daily Living In your present state of health, do you have any difficulty performing the following activities: 02/15/2020 02/13/2020  Hearing? N N  Comment no hearing aids -  Vision? N N  Comment contacts, glasses -  Difficulty concentrating or making decisions? N N  Walking or climbing stairs? N N  Dressing or bathing? N N  Doing errands, shopping? N N  Preparing Food and eating ? N -  Using the Toilet? N -  In the past six months, have you accidently leaked urine? N -  Do you have problems with loss of bowel control? N -  Managing your Medications? N -  Managing your Finances? N -  Housekeeping or managing your Housekeeping? N -  Some recent data might be hidden    Patient Care Team: Volney American, PA-C as PCP - General (Family Medicine) Greg Cutter, LCSW as Social Worker (Licensed Clinical Social Worker)    Assessment:   This is a routine wellness examination for Korianna.  Exercise Activities and Dietary recommendations Current Exercise Habits: The patient does not participate in regular exercise at present, Exercise limited by: None identified  Goals Addressed   None     Fall Risk: Fall Risk  02/15/2020 02/13/2020 11/11/2019 01/10/2019 12/02/2018  Falls in the past year? 0 0 0 0 0  Number falls in past yr: 0 - 0 - 0  Injury with Fall? 0 - 0 - 0    FALL RISK PREVENTION PERTAINING TO THE HOME:  Any stairs in or around the home? Yes  If so, are there any without handrails? No   Home free of loose throw rugs in walkways, pet beds, electrical cords, etc? Yes  Adequate lighting in your home to reduce risk of falls? Yes   ASSISTIVE DEVICES UTILIZED TO PREVENT FALLS:  Life alert? No  Use of a cane, walker or w/c? No  Grab bars in the bathroom? No  Shower chair or bench in shower? No  Elevated toilet seat or a handicapped toilet? No   DME ORDERS:  DME order needed?  no  TIMED UP AND  GO:  Unable to perform    Depression Screen PHQ 2/9 Scores 02/15/2020 02/13/2020 11/11/2019 08/08/2019  PHQ - 2 Score 1 1 2  0  PHQ- 9 Score - 5 4 1      Cognitive Function     6CIT Screen 01/10/2019 12/28/2017  What Year? 0 points 0 points  What month? 0 points 0 points  What time? 0 points 0 points  Count back from 20 0 points 0 points  Months in reverse 0 points 0 points  Repeat phrase 0 points 0 points  Total Score 0 0    Immunization History  Administered Date(s) Administered  . Influenza, High Dose Seasonal PF 07/19/2018, 07/25/2019  . Influenza-Unspecified 07/25/2019  . PFIZER SARS-COV-2 Vaccination 11/12/2019, 12/07/2019  . Pneumococcal Conjugate-13 02/13/2020  . Td 12/02/2012    Qualifies for Shingles Vaccine? Yes  Zostavax completed n/a. Due for Shingrix. Education has been provided regarding the importance of this vaccine. Pt has been advised to call insurance company to determine out of pocket expense. Advised may also receive vaccine at local pharmacy or Health Dept. Verbalized acceptance and understanding.  Tdap: Discussed need for TD/TDAP vaccine, patient verbalized understanding that this is not covered as a preventative with there insurance and to call the office if she  develops any new skin injuries, ie: cuts, scrapes, bug bites, or open wounds..  Flu Vaccine: up to date   Pneumococcal Vaccine: up to date .   Covid-19 Vaccine: Completed vaccines  Screening Tests Health Maintenance  Topic Date Due  . INFLUENZA VACCINE  04/01/2020  . PNA vac Low Risk Adult (2 of 2 - PPSV23) 02/12/2021  . MAMMOGRAM  03/06/2021  . COLONOSCOPY  03/08/2021  . TETANUS/TDAP  12/03/2022  . DEXA SCAN  Completed  . COVID-19 Vaccine  Completed  . Hepatitis C Screening  Completed    Cancer Screenings:  Colorectal Screening: Completed 2019. Repeat every 10 years  Mammogram: Completed 03/07/2019. Repeat every year;   Bone Density: Completed 03/07/2019  Lung Cancer Screening:  (Low Dose  CT Chest recommended if Age 66-80 years, 30 pack-year currently smoking OR have quit w/in 15years.) does not qualify.     Additional Screening:  Hepatitis C Screening: does qualify; Completed 2019  Vision Screening: Recommended annual ophthalmology exams for early detection of glaucoma and other disorders of the eye. Is the patient up to date with their annual eye exam?  Yes    Dental Screening: Recommended annual dental exams for proper oral hygiene  Community Resource Referral:  CRR required this visit?  No       Plan:  I have personally reviewed and addressed the Medicare Annual Wellness questionnaire and have noted the following in the patient's chart:  A. Medical and social history B. Use of alcohol, tobacco or illicit drugs  C. Current medications and supplements D. Functional ability and status E.  Nutritional status F.  Physical activity G. Advance directives H. List of other physicians I.  Hospitalizations, surgeries, and ER visits in previous 12 months J.  [facility] such as hearing and vision if needed, cognitive and depression L. Referrals and appointments   In addition, I have reviewed and discussed with patient certain preventive protocols, quality metrics, and best practice recommendations. A written personalized care plan for preventive services as well as general preventive health recommendations were provided to patient.  Signed,    Bevelyn Ngo, LPN  [id number] Nurse Health Advisor   Nurse Notes: none

## 2020-02-15 NOTE — Patient Instructions (Signed)
Ms. Bostic , Thank you for taking time to come for your Medicare Wellness Visit. I appreciate your ongoing commitment to your health goals. Please review the following plan we discussed and let me know if I can assist you in the future.   Screening recommendations/referrals: Colonoscopy: completed 2019, due 2029 Mammogram: completed 03/07/2019 Bone Density: completed 03/07/2019 Recommended yearly ophthalmology/optometry visit for glaucoma screening and checkup Recommended yearly dental visit for hygiene and checkup  Vaccinations: Influenza vaccine: due 05/2020 Pneumococcal vaccine: pneumococcal 23 due 02/12/2021 Tdap vaccine: up to date  Shingles vaccine: shingrix eligible    Covid-19:completed   Advanced directives: Advance directive discussed with you today. I have provided a copy for you to complete at home and have notarized. Once this is complete please bring a copy in to our office so we can scan it into your chart.  Conditions/risks identified: none   Next appointment: Follow up in one year for your annual wellness visit    Preventive Care 65 Years and Older, Female Preventive care refers to lifestyle choices and visits with your health care provider that can promote health and wellness. What does preventive care include?  A yearly physical exam. This is also called an annual well check.  Dental exams once or twice a year.  Routine eye exams. Ask your health care provider how often you should have your eyes checked.  Personal lifestyle choices, including:  Daily care of your teeth and gums.  Regular physical activity.  Eating a healthy diet.  Avoiding tobacco and drug use.  Limiting alcohol use.  Practicing safe sex.  Taking low-dose aspirin every day.  Taking vitamin and mineral supplements as recommended by your health care provider. What happens during an annual well check? The services and screenings done by your health care provider during your annual well  check will depend on your age, overall health, lifestyle risk factors, and family history of disease. Counseling  Your health care provider may ask you questions about your:  Alcohol use.  Tobacco use.  Drug use.  Emotional well-being.  Home and relationship well-being.  Sexual activity.  Eating habits.  History of falls.  Memory and ability to understand (cognition).  Work and work Statistician.  Reproductive health. Screening  You may have the following tests or measurements:  Height, weight, and BMI.  Blood pressure.  Lipid and cholesterol levels. These may be checked every 5 years, or more frequently if you are over 52 years old.  Skin check.  Lung cancer screening. You may have this screening every year starting at age 55 if you have a 30-pack-year history of smoking and currently smoke or have quit within the past 15 years.  Fecal occult blood test (FOBT) of the stool. You may have this test every year starting at age 17.  Flexible sigmoidoscopy or colonoscopy. You may have a sigmoidoscopy every 5 years or a colonoscopy every 10 years starting at age 46.  Hepatitis C blood test.  Hepatitis B blood test.  Sexually transmitted disease (STD) testing.  Diabetes screening. This is done by checking your blood sugar (glucose) after you have not eaten for a while (fasting). You may have this done every 1-3 years.  Bone density scan. This is done to screen for osteoporosis. You may have this done starting at age 56.  Mammogram. This may be done every 1-2 years. Talk to your health care provider about how often you should have regular mammograms. Talk with your health care provider about your test  results, treatment options, and if necessary, the need for more tests. Vaccines  Your health care provider may recommend certain vaccines, such as:  Influenza vaccine. This is recommended every year.  Tetanus, diphtheria, and acellular pertussis (Tdap, Td) vaccine. You  may need a Td booster every 10 years.  Zoster vaccine. You may need this after age 75.  Pneumococcal 13-valent conjugate (PCV13) vaccine. One dose is recommended after age 39.  Pneumococcal polysaccharide (PPSV23) vaccine. One dose is recommended after age 51. Talk to your health care provider about which screenings and vaccines you need and how often you need them. This information is not intended to replace advice given to you by your health care provider. Make sure you discuss any questions you have with your health care provider. Document Released: 09/14/2015 Document Revised: 05/07/2016 Document Reviewed: 06/19/2015 Elsevier Interactive Patient Education  2017 [place] Prevention in the Home Falls can cause injuries. They can happen to people of all ages. There are many things you can do to make your home safe and to help prevent falls. What can I do on the outside of my home?  Regularly fix the edges of walkways and driveways and fix any cracks.  Remove anything that might make you trip as you walk through a door, such as a raised step or threshold.  Trim any bushes or trees on the path to your home.  Use bright outdoor lighting.  Clear any walking paths of anything that might make someone trip, such as rocks or tools.  Regularly check to see if handrails are loose or broken. Make sure that both sides of any steps have handrails.  Any raised decks and porches should have guardrails on the edges.  Have any leaves, snow, or ice cleared regularly.  Use sand or salt on walking paths during winter.  Clean up any spills in your garage right away. This includes oil or grease spills. What can I do in the bathroom?  Use night lights.  Install grab bars by the toilet and in the tub and shower. Do not use towel bars as grab bars.  Use non-skid mats or decals in the tub or shower.  If you need to sit down in the shower, use a plastic, non-slip stool.  Keep the floor  dry. Clean up any water that spills on the floor as soon as it happens.  Remove soap buildup in the tub or shower regularly.  Attach bath mats securely with double-sided non-slip rug tape.  Do not have throw rugs and other things on the floor that can make you trip. What can I do in the bedroom?  Use night lights.  Make sure that you have a light by your bed that is easy to reach.  Do not use any sheets or blankets that are too big for your bed. They should not hang down onto the floor.  Have a firm chair that has side arms. You can use this for support while you get dressed.  Do not have throw rugs and other things on the floor that can make you trip. What can I do in the kitchen?  Clean up any spills right away.  Avoid walking on wet floors.  Keep items that you use a lot in easy-to-reach places.  If you need to reach something above you, use a strong step stool that has a grab bar.  Keep electrical cords out of the way.  Do not use floor polish or wax that makes  floors slippery. If you must use wax, use non-skid floor wax.  Do not have throw rugs and other things on the floor that can make you trip. What can I do with my stairs?  Do not leave any items on the stairs.  Make sure that there are handrails on both sides of the stairs and use them. Fix handrails that are broken or loose. Make sure that handrails are as long as the stairways.  Check any carpeting to make sure that it is firmly attached to the stairs. Fix any carpet that is loose or worn.  Avoid having throw rugs at the top or bottom of the stairs. If you do have throw rugs, attach them to the floor with carpet tape.  Make sure that you have a light switch at the top of the stairs and the bottom of the stairs. If you do not have them, ask someone to add them for you. What else can I do to help prevent falls?  Wear shoes that:  Do not have high heels.  Have rubber bottoms.  Are comfortable and fit you  well.  Are closed at the toe. Do not wear sandals.  If you use a stepladder:  Make sure that it is fully opened. Do not climb a closed stepladder.  Make sure that both sides of the stepladder are locked into place.  Ask someone to hold it for you, if possible.  Clearly mark and make sure that you can see:  Any grab bars or handrails.  First and last steps.  Where the edge of each step is.  Use tools that help you move around (mobility aids) if they are needed. These include:  Canes.  Walkers.  Scooters.  Crutches.  Turn on the lights when you go into a dark area. Replace any light bulbs as soon as they burn out.  Set up your furniture so you have a clear path. Avoid moving your furniture around.  If any of your floors are uneven, fix them.  If there are any pets around you, be aware of where they are.  Review your medicines with your doctor. Some medicines can make you feel dizzy. This can increase your chance of falling. Ask your doctor what other things that you can do to help prevent falls. This information is not intended to replace advice given to you by your health care provider. Make sure you discuss any questions you have with your health care provider. Document Released: 06/14/2009 Document Revised: 01/24/2016 Document Reviewed: 09/22/2014 Elsevier Interactive Patient Education  2017 [employer].

## 2020-05-14 ENCOUNTER — Other Ambulatory Visit: Payer: Self-pay | Admitting: Family Medicine

## 2020-05-18 NOTE — Progress Notes (Signed)
BP 110/69   Pulse 78   Temp 98.1 F (36.7 C) (Oral)   Ht 5' 3.2" (1.605 m)   Wt 128 lb (58.1 kg)   SpO2 97%   BMI 22.53 kg/m    Subjective:    Patient ID: Jordan Cantu, female    DOB: [birth year], 69 y.o.   MRN: [id number]  HPI: Jordan Cantu is a 69 y.o. female presenting on 05/21/2020 for comprehensive medical examination. 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  She takes alprazolam 0.5 mg three times daily to help with her anxiety.  Duration:controlled Anxious mood: yes  Excessive worrying: no Irritability: no  Sweating: no Nausea: no Palpitations:no Hyperventilation: no Panic attacks: no Agoraphobia: no  Obscessions/compulsions: no Depressed mood: no Depression screen [facility] 2/9 05/21/2020 02/15/2020 02/13/2020 11/11/2019 08/08/2019  Decreased Interest 0 1 1 1  0  Down, Depressed, Hopeless 0 0 0 1 0  PHQ - 2 Score 0 1 1 2  0  Altered sleeping 0 - 1 1 1   Tired, decreased energy 0 - 1 0 0  Change in appetite 0 - 1 0 0  Feeling bad or failure about yourself  0 - 0 0 0  Trouble concentrating 0 - 1 1 0  Moving slowly or fidgety/restless 0 - 0 0 0  Suicidal thoughts 0 - 0 0 0  PHQ-9 Score 0 - 5 4 1   Difficult doing work/chores Not difficult at all - Somewhat difficult - Not difficult at all  Some recent data might be hidden   GAD 7 : Generalized Anxiety Score 05/21/2020 02/13/2020 11/11/2019 08/08/2019  Nervous, Anxious, on Edge 0 1 1 0  Control/stop worrying 1 1 1 1   Worry too much - different things 1 1 1 1   Trouble relaxing 1 1 1  0  Restless 1 0 1 1  Easily annoyed or irritable 0 1 0 1  Afraid - awful might happen 0 2 1 1   Total GAD 7 Score 4 7 6 5   Anxiety Difficulty - Not difficult at all - Not difficult at all   Anhedonia: no Weight changes: no Insomnia: no   Hypersomnia: no Fatigue/loss of energy: no Feelings of worthlessness: no Feelings of guilt:  no Impaired concentration/indecisiveness: no Suicidal ideations: no  Crying spells: no Recent Stressors/Life Changes: yes   Relationship problems: no   Family stress: no     Financial stress: no    Job stress: no    Recent death/loss: yes  COPD  COPD status: uncontrolled Satisfied with current treatment?: no Oxygen use: no Dyspnea frequency: rare Cough frequency: rare Rescue inhaler frequency:  daily Limitation of activity: no Productive cough: no Last Spirometry: never done Pneumovax: Up to Date Influenza: Not up to Date  Patient reports she quit smoking almost 1 year ago.    She currently lives with: self Menopausal Symptoms: no  Depression Screen done today and results listed below:  Depression screen [facility] 2/9 05/21/2020 02/15/2020 02/13/2020 11/11/2019 08/08/2019  Decreased Interest 0 1 1 1  0  Down, Depressed, Hopeless 0 0 0 1 0  PHQ - 2 Score 0 1 1 2  0  Altered sleeping 0 - 1 1 1   Tired, decreased energy 0 - 1 0 0  Change in appetite 0 - 1 0 0  Feeling bad or failure about yourself  0 - 0 0 0  Trouble concentrating 0 - 1 1 0  Moving slowly  or fidgety/restless 0 - 0 0 0  Suicidal thoughts 0 - 0 0 0  PHQ-9 Score 0 - 5 4 1   Difficult doing work/chores Not difficult at all - Somewhat difficult - Not difficult at all  Some recent data might be hidden    The patient does not have a history of falls. I did not complete a risk assessment for falls. A plan of care for falls was not documented.   Past Medical History:  Past Medical History:  Diagnosis Date  . Anxiety   . Depression   . Emphysema, unspecified ([place])   . Insomnia   . Tobacco use 12/28/2017  . Wears contact lenses   . Wears dentures    full upper    Surgical History:  Past Surgical History:  Procedure Laterality Date  . COLONOSCOPY WITH PROPOFOL N/A 03/08/2018   Procedure: COLONOSCOPY WITH PROPOFOL;  Surgeon: Lucilla Lame, MD;  Location: [place];  Service: Endoscopy;  Laterality: N/A;  .  NOSE SURGERY  2016   cauterization  . POLYPECTOMY N/A 03/08/2018   Procedure: POLYPECTOMY;  Surgeon: Lucilla Lame, MD;  Location: [place];  Service: Endoscopy;  Laterality: N/A;  . TOOTH EXTRACTION    . WISDOM TOOTH EXTRACTION      Medications:  Current Outpatient Medications on File Prior to Visit  Medication Sig  . albuterol (PROAIR HFA) 108 (90 Base) MCG/ACT inhaler INHALE 2 PUFFS INTO THE LUNGS EVERY 6 HOURS AS NEEDED FOR WHEEZING OR SHORTNESS OF BREATH   No current facility-administered medications on file prior to visit.    Allergies:  Allergies  Allergen Reactions  . Bupropion Nausea Only    Social History:  Social History   Socioeconomic History  . Marital status: Legally Separated    Spouse name: Not on file  . Number of children: Not on file  . Years of education: Not on file  . Highest education level: High school graduate  Occupational History  . Occupation: retired  Tobacco Use  . Smoking status: Former Smoker    Packs/day: 1.00    Years: 50.00    Pack years: 50.00    Types: Cigarettes  . Smokeless tobacco: Never Used  . Tobacco comment: pt states she stopped smoking 5 months ago/ Aug  Vaping Use  . Vaping Use: Never used  Substance and Sexual Activity  . Alcohol use: Yes    Alcohol/week: 3.0 standard drinks    Types: 3 Cans of beer per week    Comment:    . Drug use: Never  . Sexual activity: Not Currently  Other Topics Concern  . Not on file  Social History Narrative   parttime caregiver for 69 year old   Social Determinants of Health   Financial Resource Strain:   . Difficulty of Paying Living Expenses: Not on file  Food Insecurity:   . Worried About Charity fundraiser in the Last Year: Not on file  . Ran Out of Food in the Last Year: Not on file  Transportation Needs:   . Lack of Transportation (Medical): Not on file  . Lack of Transportation (Non-Medical): Not on file  Physical Activity:   . Days of Exercise per Week: Not  on file  . Minutes of Exercise per Session: Not on file  Stress:   . Feeling of Stress : Not on file  Social Connections:   . Frequency of Communication with Friends and Family: Not on file  . Frequency of Social Gatherings with  Friends and Family: Not on file  . Attends Religious Services: Not on file  . Active Member of Clubs or Organizations: Not on file  . Attends Archivist Meetings: Not on file  . Marital Status: Not on file  Intimate Partner Violence:   . Fear of Current or Ex-Partner: Not on file  . Emotionally Abused: Not on file  . Physically Abused: Not on file  . Sexually Abused: Not on file   Social History   Tobacco Use  Smoking Status Former Smoker  . Packs/day: 1.00  . Years: 50.00  . Pack years: 50.00  . Types: Cigarettes  Smokeless Tobacco Never Used  Tobacco Comment   pt states she stopped smoking 5 months ago/ Aug   Social History   Substance and Sexual Activity  Alcohol Use Yes  . Alcohol/week: 3.0 standard drinks  . Types: 3 Cans of beer per week   Comment:      Family History:  Family History  Problem Relation Age of Onset  . Pulmonary fibrosis Mother   . Depression Mother   . Diabetes Father   . Lung disease Father   . Coronary artery disease Maternal Grandfather   . Diabetes Maternal Grandfather   . Breast cancer Maternal Aunt   . Breast cancer Maternal Aunt   . Lung cancer Maternal Uncle   . Lung cancer Paternal Aunt   . Breast cancer Cousin        mat cousin  . Cancer Sister        lung    Past medical history, surgical history, medications, allergies, family history and social history reviewed with patient today and changes made to appropriate areas of the chart.   Review of Systems  Constitutional: Negative.  Negative for chills and fever.  HENT: Negative.   Eyes: Negative.  Negative for blurred vision, double vision, discharge and redness.  Respiratory: Negative.  Negative for cough and wheezing.   Cardiovascular:  Negative.  Negative for chest pain and leg swelling.  Gastrointestinal: Negative.  Negative for heartburn, nausea and vomiting.  Genitourinary: Negative.  Negative for dysuria, frequency and urgency.  Musculoskeletal: Negative.   Skin: Negative.  Negative for itching and rash.  Neurological: Negative.  Negative for dizziness, weakness and headaches.  Psychiatric/Behavioral: Negative for depression and suicidal ideas. The patient is nervous/anxious and has insomnia.    All other ROS negative except what is listed above and in the HPI.      Objective:    BP 110/69   Pulse 78   Temp 98.1 F (36.7 C) (Oral)   Ht 5' 3.2" (1.605 m)   Wt 128 lb (58.1 kg)   SpO2 97%   BMI 22.53 kg/m   Wt Readings from Last 3 Encounters:  05/21/20 128 lb (58.1 kg)  02/15/20 128 lb (58.1 kg)  02/13/20 128 lb (58.1 kg)    Physical Exam Vitals and nursing note reviewed.  Constitutional:      General: She is not in acute distress.    Appearance: Normal appearance. She is normal weight. She is not ill-appearing or toxic-appearing.  HENT:     Head: Normocephalic and atraumatic.     Right Ear: Tympanic membrane, ear canal and external ear normal.     Left Ear: Tympanic membrane, ear canal and external ear normal.     Nose: Nose normal. No congestion or rhinorrhea.     Mouth/Throat:     Mouth: Mucous membranes are moist.     Pharynx:  Oropharynx is clear. No oropharyngeal exudate.  Eyes:     General: No scleral icterus.    Extraocular Movements: Extraocular movements intact.     Pupils: Pupils are equal, round, and reactive to light.  Cardiovascular:     Rate and Rhythm: Normal rate and regular rhythm.     Pulses: Normal pulses.     Heart sounds: Normal heart sounds. No murmur heard.   Pulmonary:     Effort: Pulmonary effort is normal. No respiratory distress.     Breath sounds: No wheezing or rhonchi.  Chest:     Breasts:        Right: Normal. No inverted nipple, mass, nipple discharge or skin  change.        Left: Normal. No inverted nipple, mass, nipple discharge or skin change.  Abdominal:     General: Abdomen is flat. Bowel sounds are normal. There is no distension.     Palpations: Abdomen is soft.     Tenderness: There is no abdominal tenderness.  Musculoskeletal:        General: No swelling or tenderness. Normal range of motion.     Cervical back: Normal range of motion and neck supple. No rigidity or tenderness.     Right lower leg: No edema.     Left lower leg: No edema.  Skin:    General: Skin is warm and dry.     Capillary Refill: Capillary refill takes less than 2 seconds.     Coloration: Skin is not jaundiced or pale.  Neurological:     General: No focal deficit present.     Mental Status: She is alert and oriented to person, place, and time.     Motor: No weakness.     Gait: Gait normal.  Psychiatric:        Mood and Affect: Mood normal.        Behavior: Behavior normal.        Thought Content: Thought content normal.        Judgment: Judgment normal.        Assessment & Plan:   Problem List Items Addressed This Visit      Respiratory   Emphysema, unspecified ([place])    Chronic, ongoing.  Educated on albuterol and to use only as needed for shortness of breath or cough.  If using >1 time per day for these symptoms, return to clinic.  Spirometry at next visit.      Relevant Orders   CBC with Differential/Platelet   Comprehensive metabolic panel     Musculoskeletal and Integument   Age-related osteoporosis without current pathological fracture    Chronic, ongoing.  Per DEXA scan in 2020.  Vitamin D and calcium levels checked today; will replace as indicated.      Relevant Orders   VITAMIN D 25 Hydroxy (Vit-D Deficiency, Fractures)     Other   Anxiety    Chronic, stable.  GAD-7 and PHQ-9 at baseline today.  Continue amitriptyline 100 mg nightly.  Discussed alprazolam usage for anxiety and potential for side effects.  Pt aware of risks of  psychoactive medication use to include increased sedation, respiratory suppression, falls, extrapyramidal movements,  dependence and cardiovascular events.  PT would like to continue treatment as benefit determined to outweigh risk.  Refills given for 3 months today.  Urine drug screen today.  Follow up in 3 months.       Relevant Medications   amitriptyline (ELAVIL) 50 MG tablet   ALPRAZolam Duanne Moron)  0.5 MG tablet   Other Relevant Orders   TSH   CBC with Differential/Platelet   Comprehensive metabolic panel   Insomnia    Chronic, stable with amitriptyline 100 mg nightly.  PT aware of risks of psychoactive medication use to include increased sedation, respiratory suppression, falls, extrapyramidal movements,  dependence and cardiovascular events.  PT would like to continue treatment as benefit determined to outweigh risk.  Continue amitriptyline for now.       Relevant Orders   Comprehensive metabolic panel    Other Visit Diagnoses    Annual physical exam    -  Primary   Encounter for lipid screening for cardiovascular disease       Relevant Orders   Lipid Panel w/o Chol/HDL Ratio   Screening for thyroid disorder       Relevant Orders   TSH   Long-term current use of high risk medication other than anticoagulant       Relevant Orders   [id number] 11+Oxyco+Alc+Crt-Bund       Follow up plan: Return in about 3 months (around 08/20/2020) for sleep/anxiety follow up.   LABORATORY TESTING:  - Pap smear: up to date  IMMUNIZATIONS:   - Tdap: Tetanus vaccination status reviewed: last tetanus booster within 10 years. - Influenza: Refused - waiting to get booster for COVID-19. - Pneumovax: Up to date - Prevnar: Up to date - HPV: Not applicable - Zostavax vaccine: Not applicable  SCREENING: -Mammogram: Up to date  - Colonoscopy: Up to date  - Bone Density: Up to date  -Hearing Test: Not applicable  -Spirometry: will do at next visit   PATIENT COUNSELING:   Advised to take 1 mg  of folate supplement per day if capable of pregnancy.   Sexuality: Discussed sexually transmitted diseases, partner selection, use of condoms, avoidance of unintended pregnancy  and contraceptive alternatives.   Advised to avoid cigarette smoking.  I discussed with the patient that most people either abstain from alcohol or drink within safe limits (<=14/week and <=4 drinks/occasion for males, <=7/weeks and <= 3 drinks/occasion for females) and that the risk for alcohol disorders and other health effects rises proportionally with the number of drinks per week and how often a drinker exceeds daily limits.  Discussed cessation/primary prevention of drug use and availability of treatment for abuse.   Diet: Encouraged to adjust caloric intake to maintain  or achieve ideal body weight, to reduce intake of dietary saturated fat and total fat, to limit sodium intake by avoiding high sodium foods and not adding table salt, and to maintain adequate dietary potassium and calcium preferably from fresh fruits, vegetables, and low-fat dairy products.    stressed the importance of regular exercise  Injury prevention: Discussed safety belts, safety helmets, smoke detector, smoking near bedding or upholstery.   Dental health: Discussed importance of regular tooth brushing, flossing, and dental visits.    NEXT PREVENTATIVE PHYSICAL DUE IN 1 YEAR. Return in about 3 months (around 08/20/2020) for sleep/anxiety follow up.

## 2020-05-18 NOTE — Patient Instructions (Signed)
Preventive Care 69 Years and Older, Female Preventive care refers to lifestyle choices and visits with your health care provider that can promote health and wellness. This includes:  A yearly physical exam. This is also called an annual well check.  Regular dental and eye exams.  Immunizations.  Screening for certain conditions.  Healthy lifestyle choices, such as diet and exercise. What can I expect for my preventive care visit? Physical exam Your health care provider will check:  Height and weight. These may be used to calculate body mass index (BMI), which is a measurement that tells if you are at a healthy weight.  Heart rate and blood pressure.  Your skin for abnormal spots. Counseling Your health care provider may ask you questions about:  Alcohol, tobacco, and drug use.  Emotional well-being.  Home and relationship well-being.  Sexual activity.  Eating habits.  History of falls.  Memory and ability to understand (cognition).  Work and work Statistician.  Pregnancy and menstrual history. What immunizations do I need?  Influenza (flu) vaccine  This is recommended every year. Tetanus, diphtheria, and pertussis (Tdap) vaccine  You may need a Td booster every 10 years. Varicella (chickenpox) vaccine  You may need this vaccine if you have not already been vaccinated. Zoster (shingles) vaccine  You may need this after age 33. Pneumococcal conjugate (PCV13) vaccine  One dose is recommended after age 33. Pneumococcal polysaccharide (PPSV23) vaccine  One dose is recommended after age 72. Measles, mumps, and rubella (MMR) vaccine  You may need at least one dose of MMR if you were born in 1957 or later. You may also need a second dose. Meningococcal conjugate (MenACWY) vaccine  You may need this if you have certain conditions. Hepatitis A vaccine  You may need this if you have certain conditions or if you travel or work in places where you may be exposed  to hepatitis A. Hepatitis B vaccine  You may need this if you have certain conditions or if you travel or work in places where you may be exposed to hepatitis B. Haemophilus influenzae type b (Hib) vaccine  You may need this if you have certain conditions. You may receive vaccines as individual doses or as more than one vaccine together in one shot (combination vaccines). Talk with your health care provider about the risks and benefits of combination vaccines. What tests do I need? Blood tests  Lipid and cholesterol levels. These may be checked every 5 years, or more frequently depending on your overall health.  Hepatitis C test.  Hepatitis B test. Screening  Lung cancer screening. You may have this screening every year starting at age 39 if you have a 30-pack-year history of smoking and currently smoke or have quit within the past 15 years.  Colorectal cancer screening. All adults should have this screening starting at age 36 and continuing until age 15. Your health care provider may recommend screening at age 23 if you are at increased risk. You will have tests every 1-10 years, depending on your results and the type of screening test.  Diabetes screening. This is done by checking your blood sugar (glucose) after you have not eaten for a while (fasting). You may have this done every 1-3 years.  Mammogram. This may be done every 1-2 years. Talk with your health care provider about how often you should have regular mammograms.  BRCA-related cancer screening. This may be done if you have a family history of breast, ovarian, tubal, or peritoneal cancers.  Other tests  Sexually transmitted disease (STD) testing.  Bone density scan. This is done to screen for osteoporosis. You may have this done starting at age 44. Follow these instructions at home: Eating and drinking  Eat a diet that includes fresh fruits and vegetables, whole grains, lean protein, and low-fat dairy products. Limit  your intake of foods with high amounts of sugar, saturated fats, and salt.  Take vitamin and mineral supplements as recommended by your health care provider.  Do not drink alcohol if your health care provider tells you not to drink.  If you drink alcohol: ? Limit how much you have to 0-1 drink a day. ? Be aware of how much alcohol is in your drink. In the [place] one drink equals one 12 oz bottle of beer (355 mL), one 5 oz glass of wine (148 mL), or one 1 oz glass of hard liquor (44 mL). Lifestyle  Take daily care of your teeth and gums.  Stay active. Exercise for at least 30 minutes on 5 or more days each week.  Do not use any products that contain nicotine or tobacco, such as cigarettes, e-cigarettes, and chewing tobacco. If you need help quitting, ask your health care provider.  If you are sexually active, practice safe sex. Use a condom or other form of protection in order to prevent STIs (sexually transmitted infections).  Talk with your health care provider about taking a low-dose aspirin or statin. What's next?  Go to your health care provider once a year for a well check visit.  Ask your health care provider how often you should have your eyes and teeth checked.  Stay up to date on all vaccines. This information is not intended to replace advice given to you by your health care provider. Make sure you discuss any questions you have with your health care provider. Document Revised: 08/12/2018 Document Reviewed: 08/12/2018 Elsevier Patient Education  2020 [employer].

## 2020-05-21 ENCOUNTER — Encounter: Payer: PPO | Admitting: Family Medicine

## 2020-05-21 ENCOUNTER — Encounter: Payer: Self-pay | Admitting: Nurse Practitioner

## 2020-05-21 ENCOUNTER — Other Ambulatory Visit: Payer: Self-pay

## 2020-05-21 ENCOUNTER — Ambulatory Visit (INDEPENDENT_AMBULATORY_CARE_PROVIDER_SITE_OTHER): Payer: PPO | Admitting: Nurse Practitioner

## 2020-05-21 VITALS — BP 110/69 | HR 78 | Temp 98.1°F | Ht 63.2 in | Wt 128.0 lb

## 2020-05-21 DIAGNOSIS — M810 Age-related osteoporosis without current pathological fracture: Secondary | ICD-10-CM

## 2020-05-21 DIAGNOSIS — F419 Anxiety disorder, unspecified: Secondary | ICD-10-CM

## 2020-05-21 DIAGNOSIS — Z0000 Encounter for general adult medical examination without abnormal findings: Secondary | ICD-10-CM | POA: Diagnosis not present

## 2020-05-21 DIAGNOSIS — Z79899 Other long term (current) drug therapy: Secondary | ICD-10-CM

## 2020-05-21 DIAGNOSIS — J439 Emphysema, unspecified: Secondary | ICD-10-CM | POA: Diagnosis not present

## 2020-05-21 DIAGNOSIS — G4700 Insomnia, unspecified: Secondary | ICD-10-CM

## 2020-05-21 DIAGNOSIS — Z1329 Encounter for screening for other suspected endocrine disorder: Secondary | ICD-10-CM | POA: Diagnosis not present

## 2020-05-21 DIAGNOSIS — Z136 Encounter for screening for cardiovascular disorders: Secondary | ICD-10-CM | POA: Diagnosis not present

## 2020-05-21 DIAGNOSIS — Z13220 Encounter for screening for lipoid disorders: Secondary | ICD-10-CM | POA: Diagnosis not present

## 2020-05-21 MED ORDER — ALPRAZOLAM 0.5 MG PO TABS: ORAL_TABLET | ORAL | 2 refills | Status: DC

## 2020-05-21 MED ORDER — AMITRIPTYLINE HCL 50 MG PO TABS: 100.0000 mg | ORAL_TABLET | Freq: Every day | ORAL | 1 refills | Status: DC

## 2020-05-21 NOTE — Assessment & Plan Note (Signed)
Chronic, ongoing.  Per DEXA scan in 2020.  Vitamin D and calcium levels checked today; will replace as indicated.

## 2020-05-21 NOTE — Assessment & Plan Note (Signed)
Chronic, stable.  GAD-7 and PHQ-9 at baseline today.  Continue amitriptyline 100 mg nightly.  Discussed alprazolam usage for anxiety and potential for side effects.  Pt aware of risks of psychoactive medication use to include increased sedation, respiratory suppression, falls, extrapyramidal movements,  dependence and cardiovascular events.  PT would like to continue treatment as benefit determined to outweigh risk.  Refills given for 3 months today.  Urine drug screen today.  Follow up in 3 months.

## 2020-05-21 NOTE — Assessment & Plan Note (Signed)
Chronic, ongoing.  Educated on albuterol and to use only as needed for shortness of breath or cough.  If using >1 time per day for these symptoms, return to clinic.  Spirometry at next visit.

## 2020-05-21 NOTE — Assessment & Plan Note (Signed)
Chronic, stable with amitriptyline 100 mg nightly.  PT aware of risks of psychoactive medication use to include increased sedation, respiratory suppression, falls, extrapyramidal movements,  dependence and cardiovascular events.  PT would like to continue treatment as benefit determined to outweigh risk.  Continue amitriptyline for now.

## 2020-05-22 ENCOUNTER — Telehealth: Payer: Self-pay | Admitting: Nurse Practitioner

## 2020-05-22 DIAGNOSIS — N1831 Chronic kidney disease, stage 3a: Secondary | ICD-10-CM | POA: Insufficient documentation

## 2020-05-22 DIAGNOSIS — R7989 Other specified abnormal findings of blood chemistry: Secondary | ICD-10-CM

## 2020-05-22 LAB — TSH: TSH: 6.18 u[IU]/mL — ABNORMAL HIGH (ref 0.450–4.500)

## 2020-05-22 LAB — LIPID PANEL W/O CHOL/HDL RATIO
Cholesterol, Total: 212 mg/dL — ABNORMAL HIGH (ref 100–199)
HDL: 50 mg/dL (ref >39)
LDL Chol Calc (NIH): 139 mg/dL — ABNORMAL HIGH (ref 0–99)
Triglycerides: 130 mg/dL (ref 0–149)
VLDL Cholesterol Cal: 23 mg/dL (ref 5–40)

## 2020-05-22 LAB — CBC WITH DIFFERENTIAL/PLATELET
Basophils Absolute: 0.1 10*3/uL (ref 0.0–0.2)
Basos: 1 %
EOS (ABSOLUTE): 0.3 10*3/uL (ref 0.0–0.4)
Eos: 4 %
Hematocrit: 41.3 % (ref 34.0–46.6)
Hemoglobin: 14 g/dL (ref 11.1–15.9)
Immature Grans (Abs): 0 10*3/uL (ref 0.0–0.1)
Immature Granulocytes: 0 %
Lymphocytes Absolute: 2.4 10*3/uL (ref 0.7–3.1)
Lymphs: 34 %
MCH: 32.5 pg (ref 26.6–33.0)
MCHC: 33.9 g/dL (ref 31.5–35.7)
MCV: 96 fL (ref 79–97)
Monocytes Absolute: 0.5 10*3/uL (ref 0.1–0.9)
Monocytes: 7 %
Neutrophils Absolute: 3.7 10*3/uL (ref 1.4–7.0)
Neutrophils: 54 %
Platelets: 238 10*3/uL (ref 150–450)
RBC: 4.31 x10E6/uL (ref 3.77–5.28)
RDW: 12.2 % (ref 11.7–15.4)
WBC: 7 10*3/uL (ref 3.4–10.8)

## 2020-05-22 LAB — COMPREHENSIVE METABOLIC PANEL
ALT: 18 IU/L (ref 0–32)
AST: 19 IU/L (ref 0–40)
Albumin/Globulin Ratio: 1.6 (ref 1.2–2.2)
Albumin: 4 g/dL (ref 3.8–4.8)
Alkaline Phosphatase: 109 IU/L (ref 44–121)
BUN/Creatinine Ratio: 9 — ABNORMAL LOW (ref 12–28)
BUN: 10 mg/dL (ref 8–27)
Bilirubin Total: 0.3 mg/dL (ref 0.0–1.2)
CO2: 21 mmol/L (ref 20–29)
Calcium: 9.2 mg/dL (ref 8.7–10.3)
Chloride: 103 mmol/L (ref 96–106)
Creatinine, Ser: 1.17 mg/dL — ABNORMAL HIGH (ref 0.57–1.00)
GFR calc Af Amer: 55 mL/min/{1.73_m2} — ABNORMAL LOW (ref >59)
GFR calc non Af Amer: 48 mL/min/{1.73_m2} — ABNORMAL LOW (ref >59)
Globulin, Total: 2.5 g/dL (ref 1.5–4.5)
Glucose: 95 mg/dL (ref 65–99)
Potassium: 4.2 mmol/L (ref 3.5–5.2)
Sodium: 139 mmol/L (ref 134–144)
Total Protein: 6.5 g/dL (ref 6.0–8.5)

## 2020-05-22 LAB — VITAMIN D 25 HYDROXY (VIT D DEFICIENCY, FRACTURES): Vit D, 25-Hydroxy: 12.3 ng/mL — ABNORMAL LOW (ref 30.0–100.0)

## 2020-05-22 NOTE — Telephone Encounter (Signed)
Called patient to discuss labs from yesterday.  If she returns call, please let her know that everything looks pretty good.    Her cholesterol levels continue to stay slightly elevated; she does not need medication treatment at this time for her levels but she should continue to work on dietary and lifestyle changes to help reduce cholesterol levels.  Eating a diet low in saturated fats (found in dairy products, red meats, and fried foods) can help with this.   Her thyroid level was elevated.  I would like to recheck this along with some other thyroid function testing in about 4 weeks.  I will put the lab order in and she can stop by our office to have the blood drawn.   Her Vitamin D level was low (as we suspected).  She should pick up Vitamin D3 over-the-counter 800-1,000 units and start taking this daily.  We will recheck her vitamin D level in 3 months.  Blood counts were all normal.  Liver function and electrolytes looked good.  Kidney function shows some kidney disease but improved from about 6 months ago.  Continue to hydrate with water as much as possible and we will plan to recheck at follow up in 3 months.

## 2020-05-23 ENCOUNTER — Ambulatory Visit: Payer: Self-pay | Admitting: *Deleted

## 2020-05-23 NOTE — Telephone Encounter (Signed)
Pt given lab results per notes of [name], NP on 05/23/20. Pt verbalized understanding.

## 2020-05-30 ENCOUNTER — Telehealth: Payer: Self-pay

## 2020-05-30 NOTE — Telephone Encounter (Signed)
-----   Message from Eulogio Bear, NP sent at 05/28/2020 11:34 AM EDT ----- It looks like this patient's urine screen was not done with labs.  Please call her to see if she can come give [place] a urine sample.   ----- Message ----- From: SYSTEM Sent: 05/26/2020  12:11 AM EDT To: Eulogio Bear, NP

## 2020-05-30 NOTE — Progress Notes (Signed)
Called patient and LVM for patient to return my call

## 2020-05-30 NOTE — Telephone Encounter (Signed)
Called patient and LVM for her to return my call.

## 2020-05-31 NOTE — Telephone Encounter (Signed)
Called and LVM asking for patient to please return my call.  

## 2020-05-31 NOTE — Telephone Encounter (Signed)
That is fine 

## 2020-05-31 NOTE — Telephone Encounter (Signed)
Pt states that she does not want to set up appt at this time. She states that she will be back in a month for more blood work and will do it then. Please advise.

## 2020-07-02 IMAGING — CT CT ABD-PELV W/ CM
2 of 5 series · 15 of 46 positions shown, 17 images · IV contrast (APPLIED)
Comparison: Abdomen radiographs 03/11/2018, chest CT

CLINICAL DATA: Abdominal distention after colonoscopy on
03/08/2018. Several polyps removed. No bowel movement since
colonoscopy.

EXAM:
CT ABDOMEN AND PELVIS WITH CONTRAST
TECHNIQUE: Multidetector CT imaging of the abdomen and pelvis was performed
using the standard protocol following bolus administration of
intravenous contrast.
CONTRAST:  75mL OMNIPAQUE IOHEXOL 300 MG/ML  SOLN

[Series 2: routine abd/pel with · axial · 0.69mm/px · z∈[-523,-138]mm · 12 of 87 slices shown, 14 images]
[im 5/87  soft-tissue]
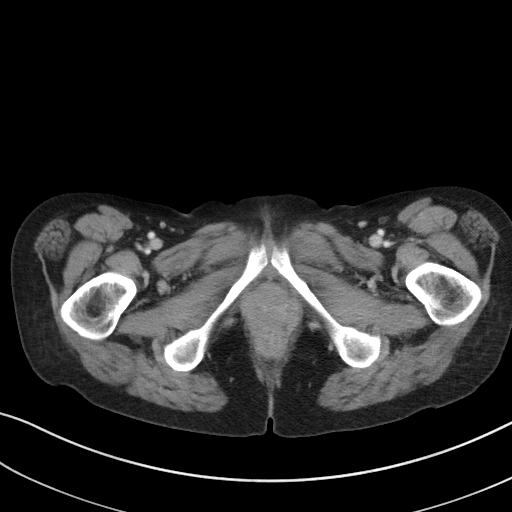
[im 5/87  bone]
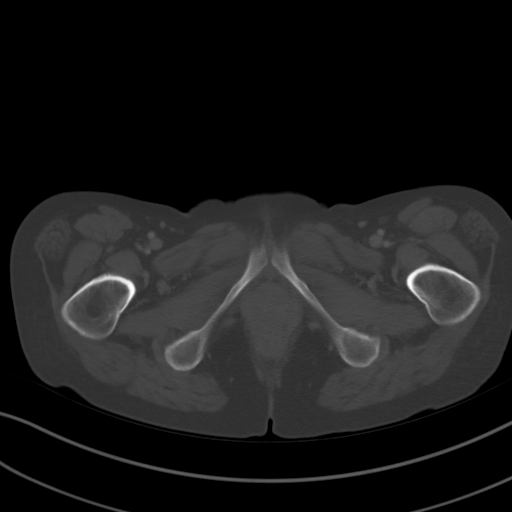
[im 14/87  soft-tissue]
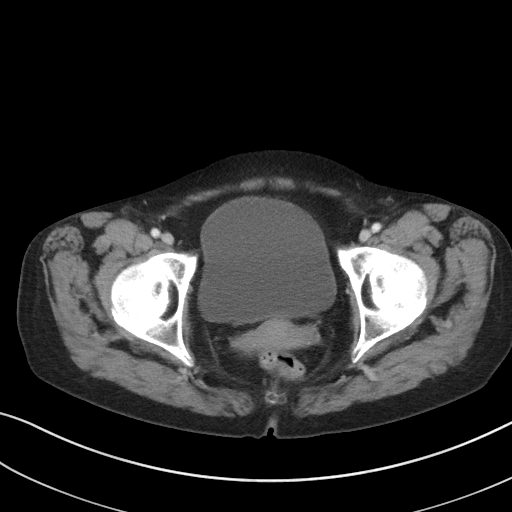
[im 19/87  soft-tissue]
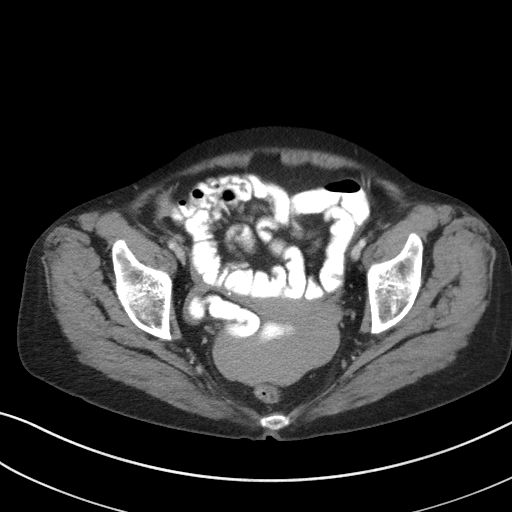
[im 28/87  soft-tissue]
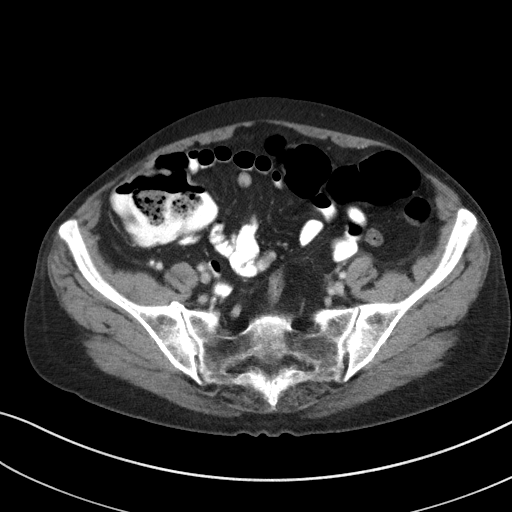
[im 32/87  soft-tissue]
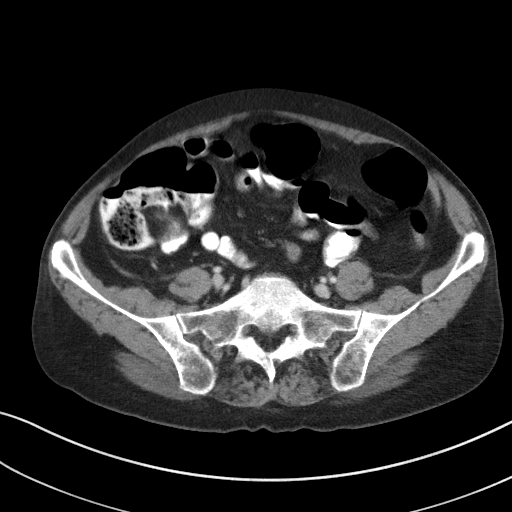
[im 41/87  soft-tissue]
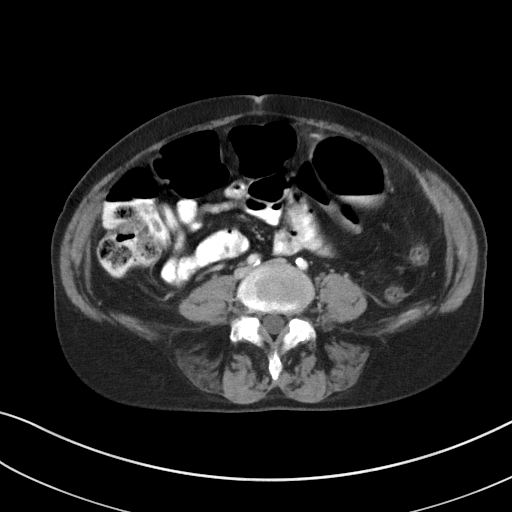
[im 46/87  soft-tissue]
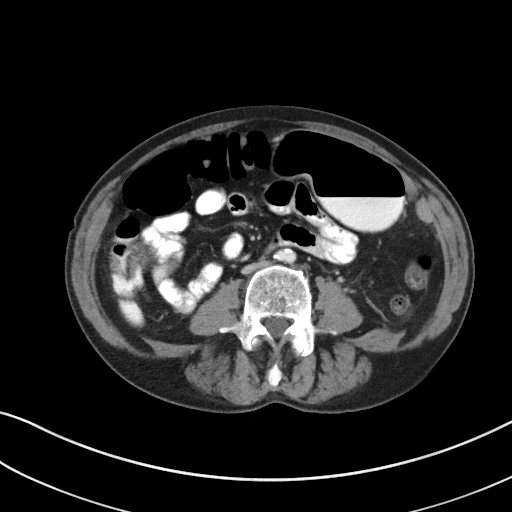
[im 55/87  soft-tissue]
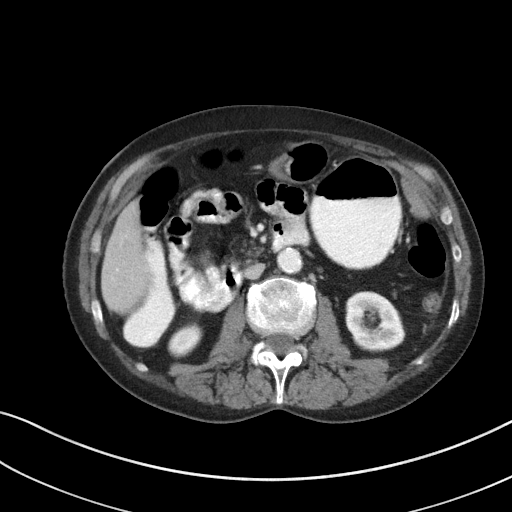
[im 59/87  soft-tissue]
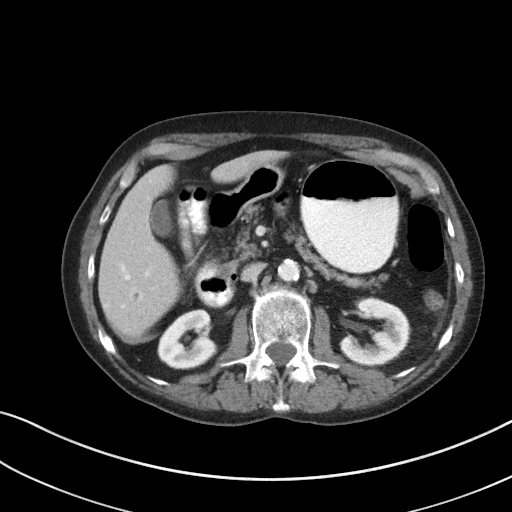
[im 59/87  bone]
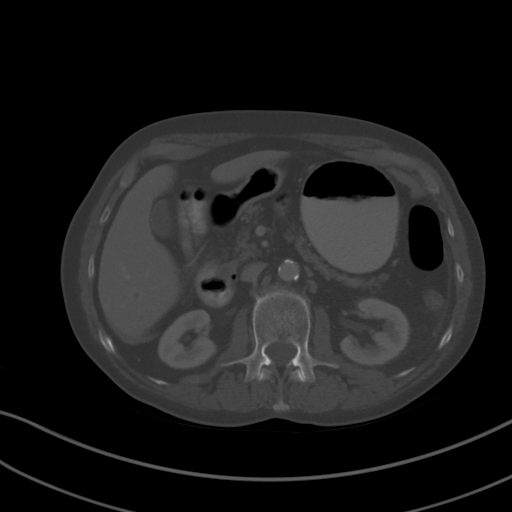
[im 68/87  soft-tissue]
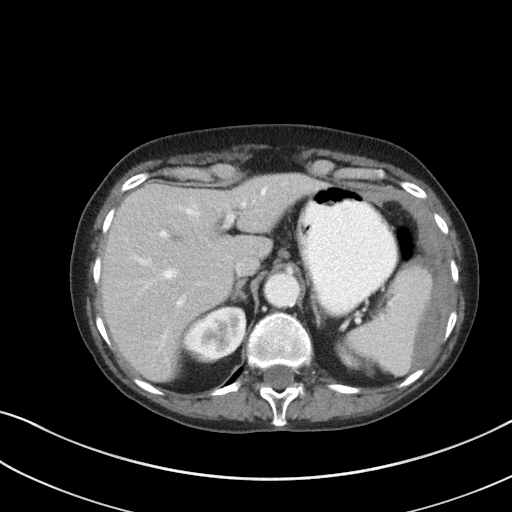
[im 73/87  soft-tissue]
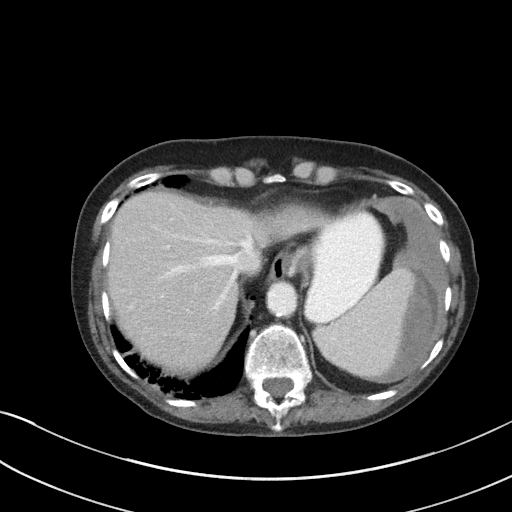
[im 82/87  soft-tissue]
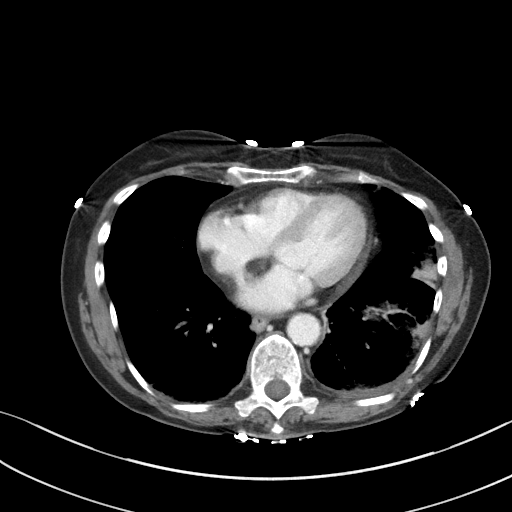

[Series 5: coronal st · coronal · 0.70mm/px · 3 of 77 slices shown]
[im 26/77  soft-tissue]
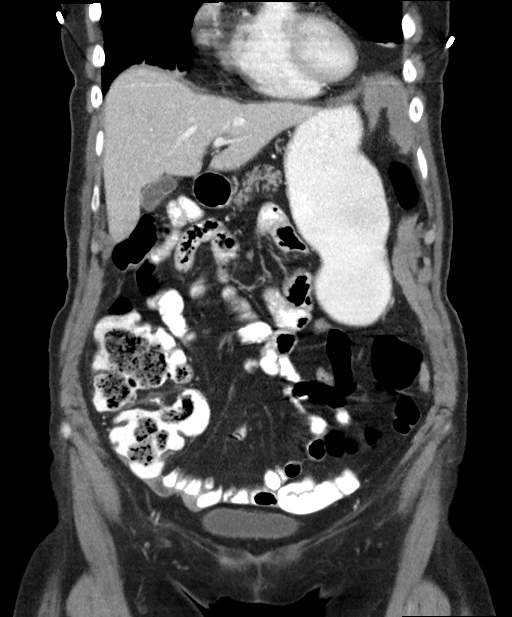
[im 34/77  soft-tissue]
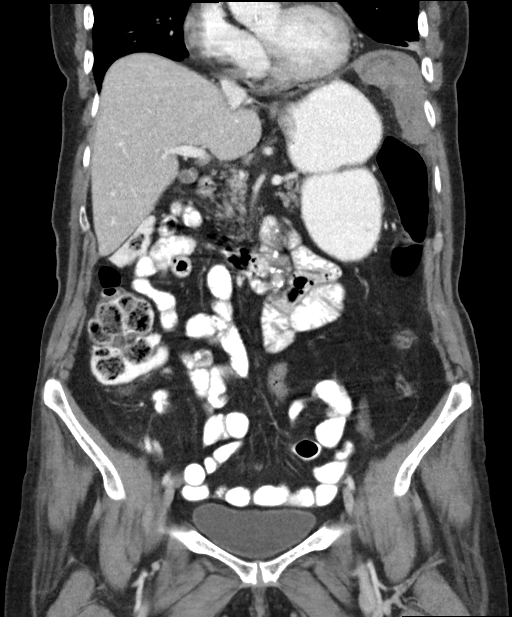
[im 43/77  soft-tissue]
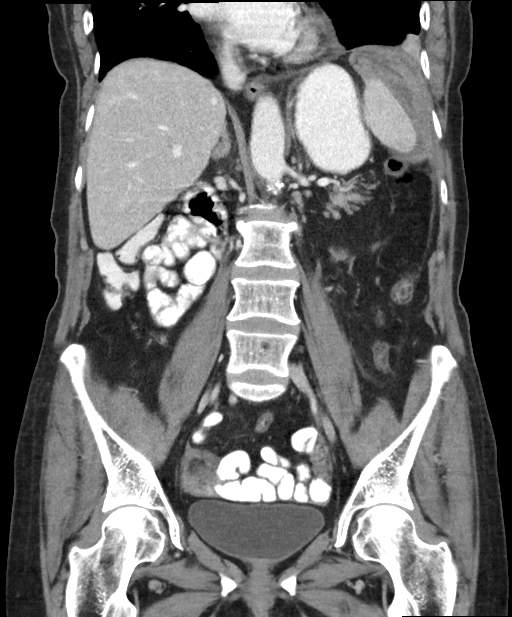

[15 of 46 positions shown; findings below may reference images not displayed]

FINDINGS: Lower chest: Normal heart size without pericardial effusion. New
small volume left pleural effusion. Five and 7 mm in average
subpleural nodules in the left lower lobe in the setting of
centrilobular emphysema previously described on recent CT of the
chest. Consolidation in the lingula likely related to atelectasis.

Hepatobiliary: No enhancing mass lesions of the liver. Physiologic
distention of the gallbladder without secondary signs of acute
cholecystitis.

Pancreas: Atrophic pancreas.

Spleen: There is a splenic laceration with subcapsular hemorrhage
and extra capsular blood products noted in the left upper quadrant
and along the lateral and posterior aspect of the spleen. No
evidence of active extravasation.

Adrenals/Urinary Tract: Adrenal glands are unremarkable. Kidneys are
normal, without renal calculi, focal lesion, or hydronephrosis.
Bladder is unremarkable.

Stomach/Bowel: No bowel perforation. Contrast reaches the colon
without obstruction. No inflammation. Normal appendix.

Vascular/Lymphatic: Moderate aortoiliac atherosclerosis. No
lymphadenopathy.

Reproductive: The uterus is partially obscured by the adjacent
complex free fluid. No adnexal mass.

Other: Complex moderate volume hyperdense fluid in the pelvis likely
related to the splenic laceration, measuring 8.7 x 4.2 x 3.8 cm
(volume = 73 cm^3). Lesser amount of fluid adjacent to the tip of
the right hepatic lobe.

Musculoskeletal: T9 through T12 degenerative disc disease. No
aggressive osseous abnormalities.
IMPRESSION: 1. Splenic laceration with subcapsular and moderate extracapsular
blood products noted in the left upper quadrant and pelvis with a
smaller volume of fluid adjacent to the tip of the liver. These
results were called by telephone at the time of interpretation on
03/11/2018 at [DATE] to Dr. DON LOLITO ONACRAM , who verbally acknowledged
these results.
2. No evidence of active extravasation.
3. Redemonstration of pulmonary nodules in the left lower lobe in
the setting of centrilobular emphysema. Follow-up as recommended on
recent chest CT. Small left pleural effusion.

## 2020-07-05 IMAGING — CR DG ABDOMEN 2V
1 series · 2 of 2 positions shown · non-contrast
Comparison: Abdominal radiograph 03/12/2018 at [DATE] a.m..

CLINICAL DATA: 66-year-old female with history of abdominal
distention for the past 3 days. Unable to have bowel movement or
passed gas. This

EXAM:
ABDOMEN - 2 VIEW

[Series 1: dg abd 2 views · 0.14mm/px · 2 of 2 slices shown]
[im 1/2]
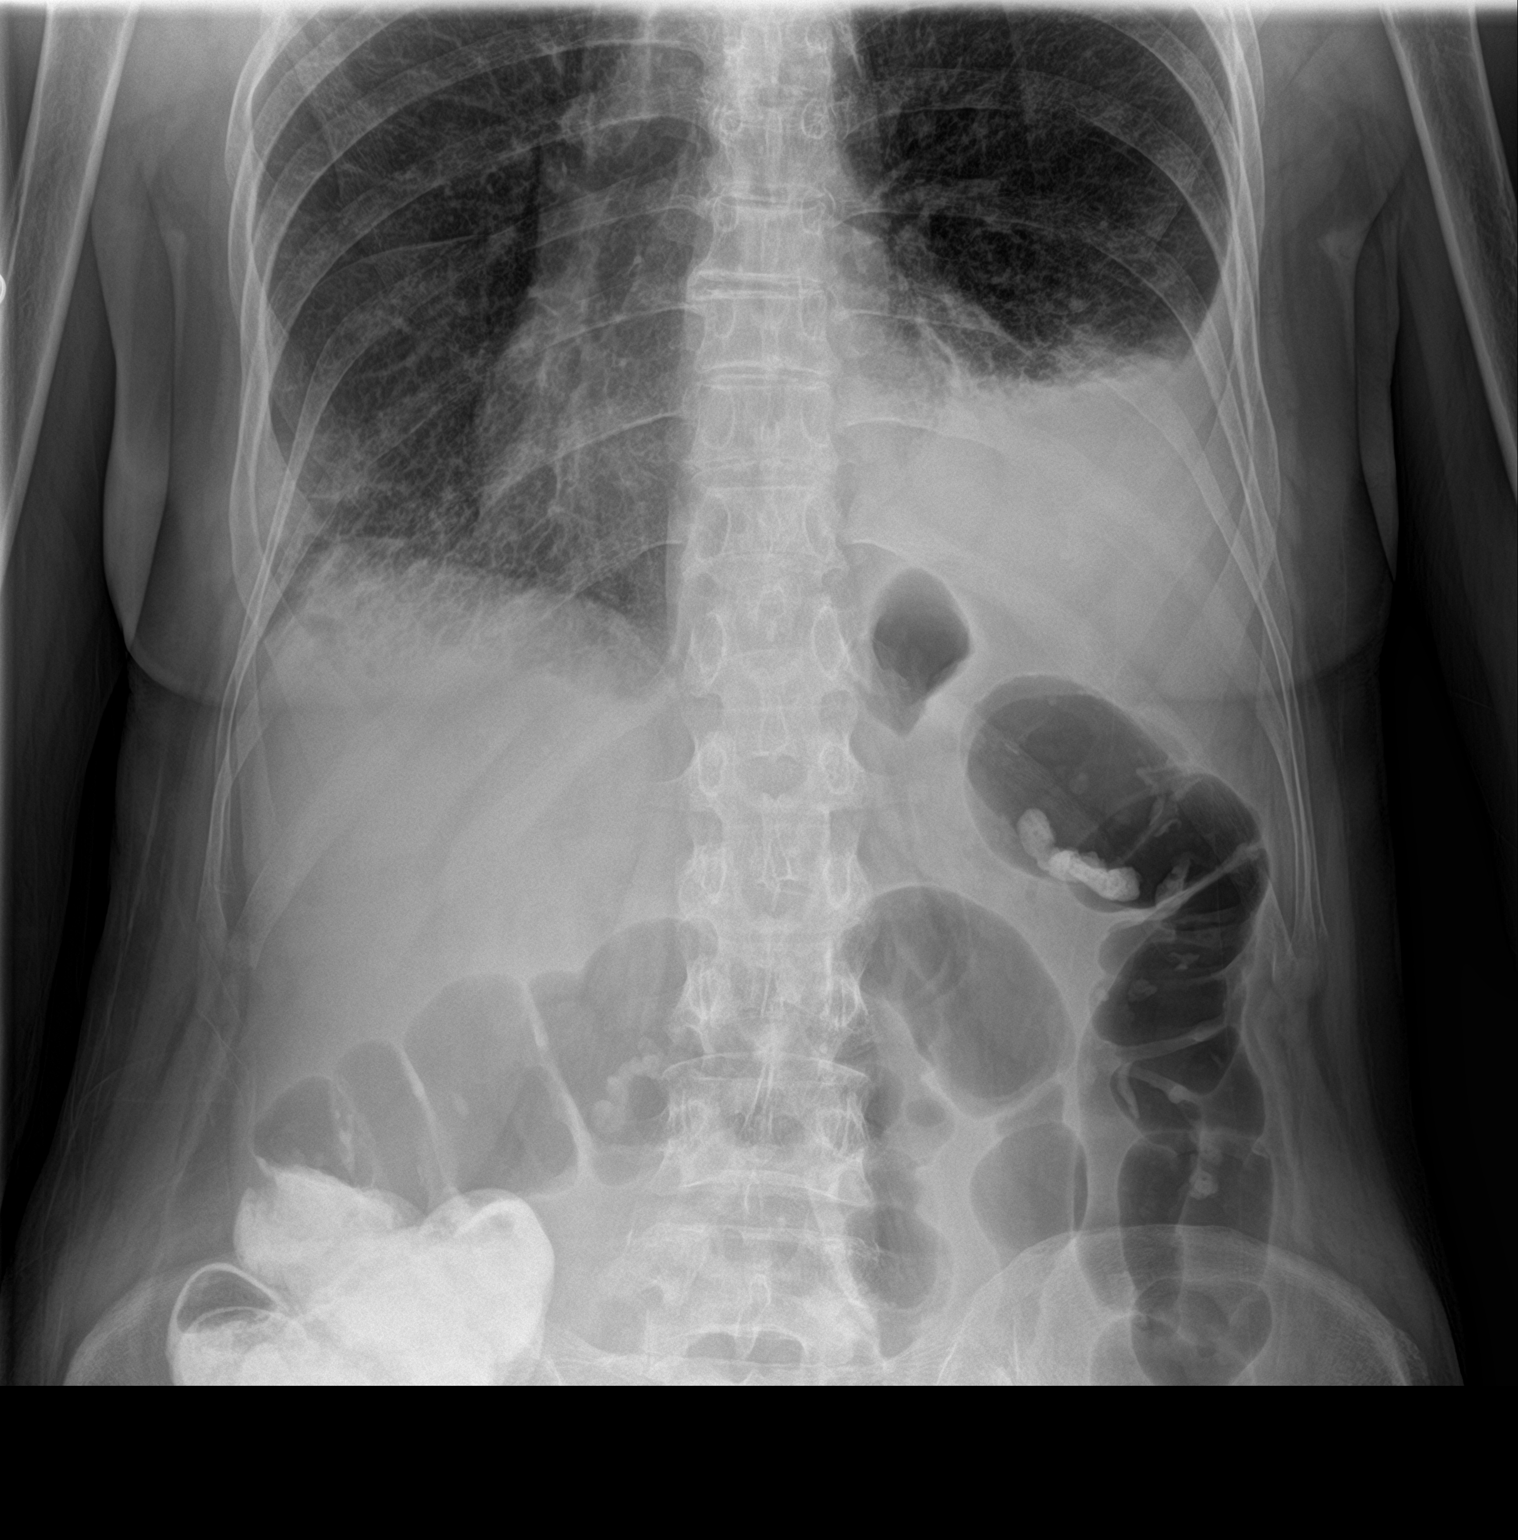
[im 2/2]
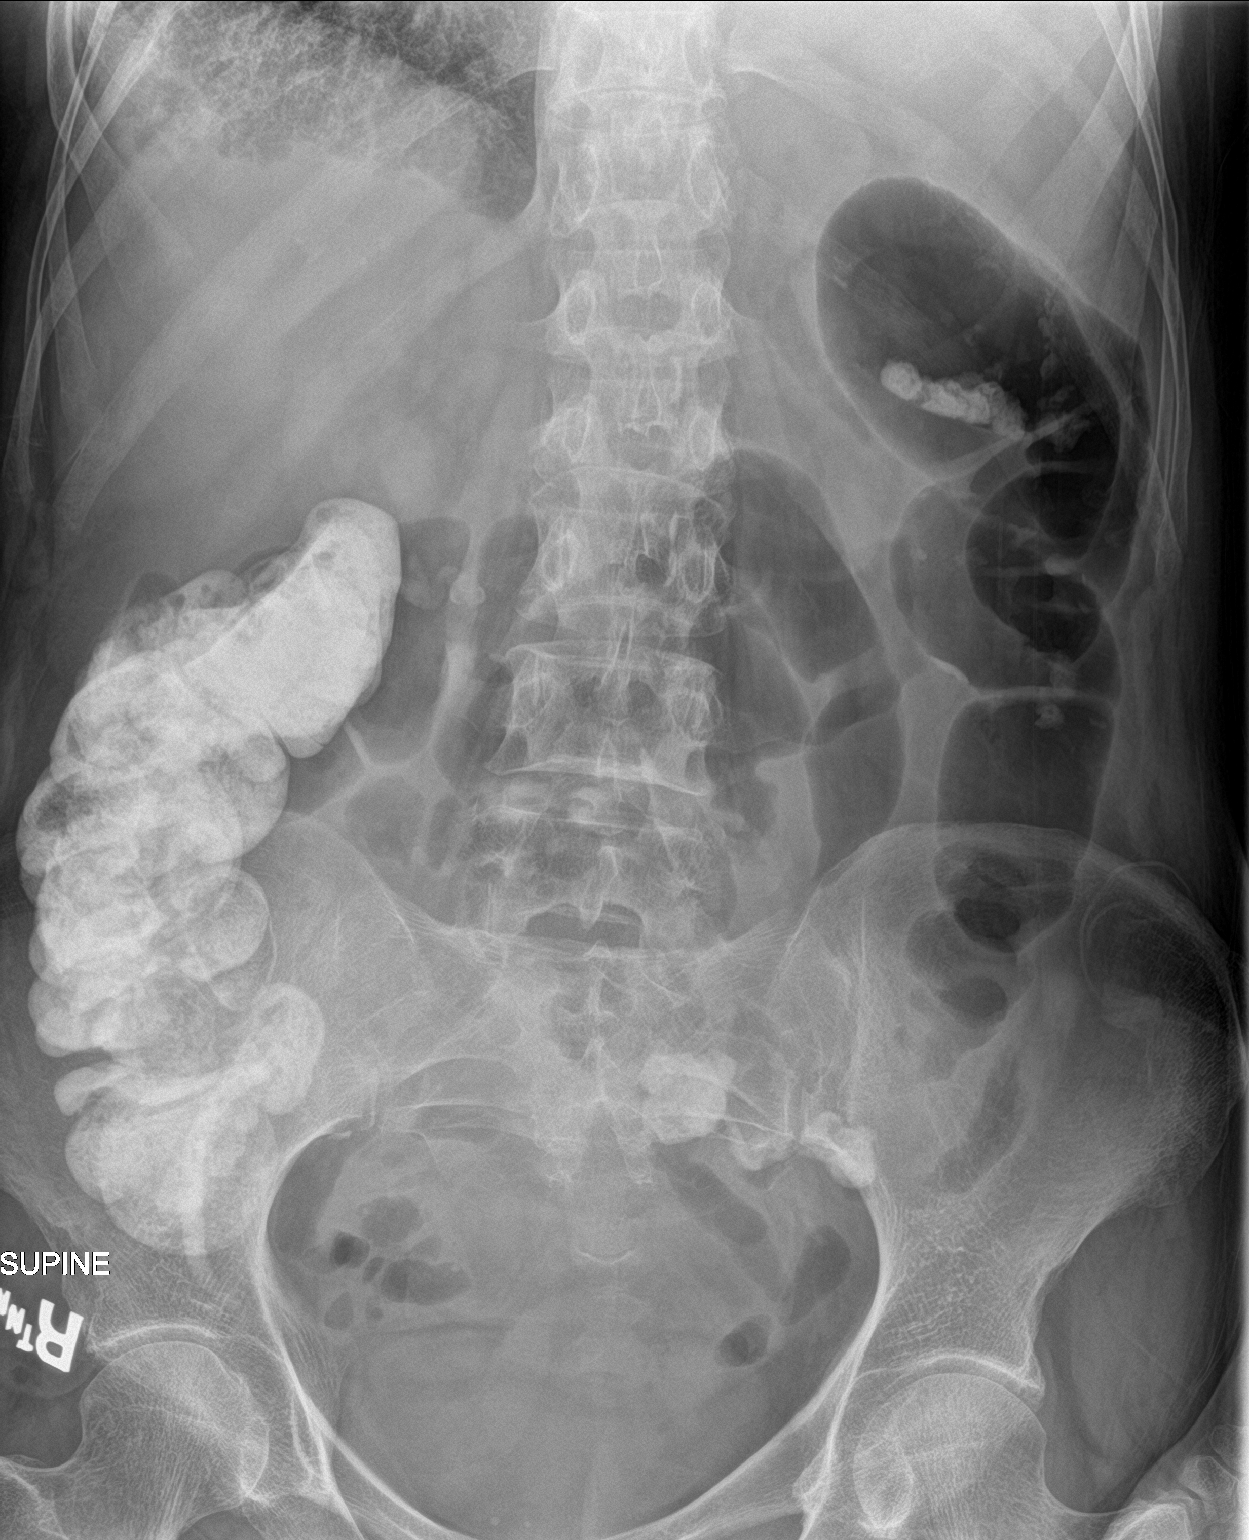

[2 of 2 positions shown; findings below may reference images not displayed]

FINDINGS: Small right and moderate left pleural effusions. Oral contrast
material remains in the cecum. Gas and stool are noted throughout
the colon. Minimal distal rectal gas. Several nondilated loops of
gas-filled small bowel are noted in the central abdomen. No
pneumoperitoneum.
IMPRESSION: 1. Nonspecific, nonobstructive bowel gas pattern.
2. No significant transit of oral contrast material from the cecum
compared to the examination from 2 days ago, which may indicate an
ileus.
3. No pneumoperitoneum.
4. Increasing bilateral pleural effusions, small on the right and
moderate on the left.

## 2020-08-20 ENCOUNTER — Ambulatory Visit: Payer: PPO | Admitting: Nurse Practitioner

## 2020-08-22 ENCOUNTER — Encounter: Payer: Self-pay | Admitting: Family Medicine

## 2020-08-22 ENCOUNTER — Ambulatory Visit (INDEPENDENT_AMBULATORY_CARE_PROVIDER_SITE_OTHER): Payer: PPO | Admitting: Family Medicine

## 2020-08-22 ENCOUNTER — Other Ambulatory Visit: Payer: Self-pay

## 2020-08-22 VITALS — BP 128/84 | HR 116 | Temp 97.7°F | Wt 124.2 lb

## 2020-08-22 DIAGNOSIS — R7989 Other specified abnormal findings of blood chemistry: Secondary | ICD-10-CM

## 2020-08-22 DIAGNOSIS — F331 Major depressive disorder, recurrent, moderate: Secondary | ICD-10-CM

## 2020-08-22 DIAGNOSIS — F419 Anxiety disorder, unspecified: Secondary | ICD-10-CM | POA: Diagnosis not present

## 2020-08-22 DIAGNOSIS — Z87891 Personal history of nicotine dependence: Secondary | ICD-10-CM | POA: Diagnosis not present

## 2020-08-22 DIAGNOSIS — J439 Emphysema, unspecified: Secondary | ICD-10-CM | POA: Diagnosis not present

## 2020-08-22 DIAGNOSIS — G4700 Insomnia, unspecified: Secondary | ICD-10-CM | POA: Diagnosis not present

## 2020-08-22 DIAGNOSIS — M810 Age-related osteoporosis without current pathological fracture: Secondary | ICD-10-CM

## 2020-08-22 DIAGNOSIS — E559 Vitamin D deficiency, unspecified: Secondary | ICD-10-CM

## 2020-08-22 MED ORDER — AMITRIPTYLINE HCL 50 MG PO TABS: 100.0000 mg | ORAL_TABLET | Freq: Every day | ORAL | 1 refills | Status: DC

## 2020-08-22 MED ORDER — ALPRAZOLAM 0.5 MG PO TABS: ORAL_TABLET | ORAL | 2 refills | Status: DC

## 2020-08-22 NOTE — Progress Notes (Signed)
° °  SUBJECTIVE:   CHIEF COMPLAINT / HPI:   Patient Active Problem List   Diagnosis Date Noted   Chronic kidney disease, stage 3a ([place]) 05/22/2020   Age-related osteoporosis without current pathological fracture 05/21/2020   Former cigarette smoker 01/20/2019   Controlled substance agreement signed 04/16/2018   Spleen laceration 03/11/2018   Benign neoplasm of cecum    Benign neoplasm of transverse colon    Rectal polyp    Polyp of sigmoid colon    Emphysema, unspecified ([place]) 12/28/2017   Anxiety 12/28/2017   Depression 12/28/2017   Insomnia 12/28/2017   Anxiety/Depression - Medications: amitriptyline 100mg  qhs, xanax 0.5mg  TID prn - Taking: taking xanax TID currently.  - Counseling: no - Previous hospitalizations: no - not doing well recently. - Current stressors: recent passing of sister in November - Coping Mechanisms: visiting friends  INSOMNIA Duration: chronic Satisfied with sleep quality: yes Difficulty falling asleep: no Difficulty staying asleep: no Waking a few hours after sleep onset: no Early morning awakenings: no Daytime hypersomnolence: no Wakes feeling refreshed: yes Good sleep hygiene: yes Apnea: no Snoring: yes Depressed/anxious mood: yes Recent stress: yes Restless legs/nocturnal leg cramps: no Chronic pain/arthritis: no History of sleep study: no Treatments attempted: melatonin   Emphysema/COPD - Medications: albuterol prn - Compliance: good, last used albuterol this am for cough, helped. - Exacerbations in last 6 months: none - due for repeat lung cancer screening, follows with Oncology. Will call for appt.   OBJECTIVE:   BP 128/84    Pulse (!) 116    Temp 97.7 F (36.5 C) (Oral)    Wt 124 lb 3.2 oz (56.3 kg)    SpO2 96%    BMI 21.86 kg/m   Gen: elderly, in NAD Cardiac: regular rhythm, tachycardic Lungs: CTAB, no wheeze Psych: tearful when discussing sister's death. Appropriately dressed and well groomed. No tangential  thought process or pressured speech.  ASSESSMENT/PLAN:   Emphysema, unspecified ([place]) Doing well, lungs clear today. Due for repeat low dose CT for nodule f/u, order placed today.  Age-related osteoporosis without current pathological fracture Checking vit D levels today.  Depression Worsened with recent passing of sister. Discussed coping mechanisms. Recommended establishing with grief counselor, patient declined. Instructed on judicious use of benzos due to age and risk for falls, no h/o falls. Refills sent.   Anxiety Worsened with recent passing of sister. Discussed coping mechanisms. Recommended establishing with grief counselor, patient declined. Instructed on judicious use of benzos due to age and risk for falls, no h/o falls. Refills sent. F/u 3 months.  Former cigarette smoker Due for low dose CT, ordered today.  Insomnia Doing well on amitriptyline. Has decent sleep hygiene. Refill sent. F/u in 3 months.    Myles Gip, DO

## 2020-08-22 NOTE — Patient Instructions (Signed)
It was great to see you!  Our plans for today:  - Call [name] for another appointment to follow up on your lungs. - Make sure to only take the xanax as needed. Try going for a walk or talking to a friend/family member before taking xanax to see if this can help calm you down. - Consider talking to a grief counselor.  - Come back in 3 months.  We are checking some labs today, we will call you with these results.   Take care and seek immediate care sooner if you develop any concerns.   Dr. Ky Barban

## 2020-08-22 NOTE — Assessment & Plan Note (Addendum)
Worsened with recent passing of sister. Discussed coping mechanisms. Recommended establishing with grief counselor, patient declined. Instructed on judicious use of benzos due to age and risk for falls, no h/o falls. Refills sent. F/u 3 months.

## 2020-08-22 NOTE — Assessment & Plan Note (Signed)
Checking vit D levels today.

## 2020-08-22 NOTE — Assessment & Plan Note (Addendum)
Doing well, lungs clear today. Due for repeat low dose CT for nodule f/u, order placed today.

## 2020-08-22 NOTE — Assessment & Plan Note (Signed)
Due for low dose CT, ordered today.

## 2020-08-22 NOTE — Assessment & Plan Note (Signed)
Doing well on amitriptyline. Has decent sleep hygiene. Refill sent. F/u in 3 months. 

## 2020-08-22 NOTE — Assessment & Plan Note (Signed)
Worsened with recent passing of sister. Discussed coping mechanisms. Recommended establishing with grief counselor, patient declined. Instructed on judicious use of benzos due to age and risk for falls, no h/o falls. Refills sent.

## 2020-08-23 ENCOUNTER — Other Ambulatory Visit: Payer: Self-pay | Admitting: Family Medicine

## 2020-08-23 LAB — THYROID PANEL WITH TSH
Free Thyroxine Index: 1.7 (ref 1.2–4.9)
T3 Uptake Ratio: 29 % (ref 24–39)
T4, Total: 5.7 ug/dL (ref 4.5–12.0)
TSH: 2.13 u[IU]/mL (ref 0.450–4.500)

## 2020-08-23 LAB — VITAMIN D 25 HYDROXY (VIT D DEFICIENCY, FRACTURES): Vit D, 25-Hydroxy: 17.5 ng/mL — ABNORMAL LOW (ref 30.0–100.0)

## 2020-08-23 MED ORDER — CALCIUM CARBONATE-VITAMIN D 500-200 MG-UNIT PO TABS: 2.0000 | ORAL_TABLET | Freq: Every day | ORAL | 3 refills | Status: DC

## 2020-09-11 ENCOUNTER — Ambulatory Visit: Admission: RE | Admit: 2020-09-11 | Payer: PPO | Source: Ambulatory Visit

## 2020-09-27 ENCOUNTER — Other Ambulatory Visit: Payer: Self-pay | Admitting: Family Medicine

## 2020-09-27 NOTE — Telephone Encounter (Signed)
Requested medication (s) are due for refill today:   No a little early  Pt requesting advancce refill  Requested medication (s) are on the active medication list:   Yes  Future visit scheduled:   No  was to f/u with Dr. Ky Barban per her notes   Last ordered: 08/22/2020 #60, 1 refill  Clinic note:   Was sure whether to refill this or not.   She is for a f/u with Dr. Ky Barban.   Returned for further disposition by Dr. Ky Barban.   Requested Prescriptions  Pending Prescriptions Disp Refills   amitriptyline (ELAVIL) 50 MG tablet [Pharmacy Med Name: AMITRIPTYLINE 50MG  TABLETS] 60 tablet 1    Sig: TAKE 2 TABLETS BY MOUTH EVERY NIGHT AT BEDTIME FOR SLEEP      Psychiatry:  Antidepressants - Heterocyclics (TCAs) Passed - 09/27/2020  8:00 AM      Passed - Completed PHQ-2 or PHQ-9 in the last 360 days      Passed - Valid encounter within last 6 months    Recent Outpatient Visits           1 month ago Elevated TSH   [place], [place]   4 months ago Annual physical exam   [facility] Eulogio Bear, NP   7 months ago [place], DO   10 months ago [place], [place], [place]   1 year ago Cigarette smoker   [place], [place], [place]

## 2020-11-21 DIAGNOSIS — I700 Atherosclerosis of aorta: Secondary | ICD-10-CM | POA: Insufficient documentation

## 2020-12-22 ENCOUNTER — Other Ambulatory Visit: Payer: Self-pay | Admitting: Family Medicine

## 2020-12-22 NOTE — Telephone Encounter (Signed)
Requested Prescriptions  Pending Prescriptions Disp Refills  . albuterol (VENTOLIN HFA) 108 (90 Base) MCG/ACT inhaler [Pharmacy Med Name: ALBUTEROL HFA INH (200 PUFFS)8.5GM] 25.5 g 0    Sig: INHALE 2 PUFFS INTO THE LUNGS EVERY 6 HOURS AS NEEDED FOR WHEEZING OR SHORTNESS OF BREATH     Pulmonology:  Beta Agonists Failed - 12/22/2020  9:41 AM      Failed - One inhaler should last at least one month. If the patient is requesting refills earlier, contact the patient to check for uncontrolled symptoms.      Passed - Valid encounter within last 12 months    Recent Outpatient Visits          4 months ago Elevated TSH   [place], DO   7 months ago Annual physical exam   [facility] Eulogio Bear, NP   10 months ago [place], [place], DO   1 year ago Anxiety   [facility], [place]   1 year ago Cigarette smoker   [place], Lilia [place], [place]      Future Appointments            In 5 days Jon Billings, NP [facility], [place]

## 2020-12-27 ENCOUNTER — Ambulatory Visit (INDEPENDENT_AMBULATORY_CARE_PROVIDER_SITE_OTHER): Payer: PPO | Admitting: Nurse Practitioner

## 2020-12-27 ENCOUNTER — Encounter: Payer: Self-pay | Admitting: Nurse Practitioner

## 2020-12-27 ENCOUNTER — Other Ambulatory Visit: Payer: Self-pay

## 2020-12-27 VITALS — BP 128/77 | HR 87 | Temp 97.0°F | Wt 131.1 lb

## 2020-12-27 DIAGNOSIS — J439 Emphysema, unspecified: Secondary | ICD-10-CM | POA: Diagnosis not present

## 2020-12-27 DIAGNOSIS — G4700 Insomnia, unspecified: Secondary | ICD-10-CM

## 2020-12-27 DIAGNOSIS — F419 Anxiety disorder, unspecified: Secondary | ICD-10-CM | POA: Diagnosis not present

## 2020-12-27 DIAGNOSIS — Z79899 Other long term (current) drug therapy: Secondary | ICD-10-CM

## 2020-12-27 DIAGNOSIS — I700 Atherosclerosis of aorta: Secondary | ICD-10-CM

## 2020-12-27 DIAGNOSIS — N1831 Chronic kidney disease, stage 3a: Secondary | ICD-10-CM

## 2020-12-27 MED ORDER — ALPRAZOLAM 0.5 MG PO TABS: ORAL_TABLET | ORAL | 2 refills | Status: DC

## 2020-12-27 MED ORDER — SPIRIVA RESPIMAT 2.5 MCG/ACT IN AERS: 2.0000 | INHALATION_SPRAY | Freq: Every day | RESPIRATORY_TRACT | 1 refills | Status: DC

## 2020-12-27 MED ORDER — AMITRIPTYLINE HCL 50 MG PO TABS: 100.0000 mg | ORAL_TABLET | Freq: Every day | ORAL | 1 refills | Status: DC

## 2020-12-27 MED ORDER — ALBUTEROL SULFATE (2.5 MG/3ML) 0.083% IN NEBU: 2.5000 mg | INHALATION_SOLUTION | Freq: Four times a day (QID) | RESPIRATORY_TRACT | 1 refills | Status: AC | PRN

## 2020-12-27 MED ORDER — ALBUTEROL SULFATE (2.5 MG/3ML) 0.083% IN NEBU
2.5000 mg | INHALATION_SOLUTION | Freq: Once | RESPIRATORY_TRACT | Status: AC
  Administered 2020-12-27: 2.5 mg via RESPIRATORY_TRACT

## 2020-12-27 MED ORDER — CALCIUM CARBONATE-VITAMIN D 500-200 MG-UNIT PO TABS: 2.0000 | ORAL_TABLET | Freq: Every day | ORAL | 3 refills | Status: DC

## 2020-12-27 NOTE — Assessment & Plan Note (Signed)
Doing well on amitriptyline. Has decent sleep hygiene. Refill sent. F/u in 3 months.

## 2020-12-27 NOTE — Progress Notes (Signed)
BP 128/77   Pulse 87   Temp (!) 97 F (36.1 C)   Wt 131 lb 2 oz (59.5 kg)   SpO2 99%   BMI 23.08 kg/m    Subjective:    Patient ID: Jordan Cantu, female    DOB: [birth year], 70 y.o.   MRN: [id number]  HPI: Jordan Cantu is a 70 y.o. female  Chief Complaint  Patient presents with  . COPD   ANXIETY Patient states her anxiety is well controlled. She uses the Xanax TID as prescribed.  Her sister passed away in 11-Aug-2023 last year and since then she has had worsening anxiety.  She states the Xanax works well for her.   EMPHYSEMA Patient is using her albuterol inhaler every 6 hours.  She does not have a maintenance inhaler.  However, she quit smoking in September.  Uses Chantix as needed when she feels like having a cigarette.  INSOMNIA Patient states that the Amitriptyline works well for her.  She denies concerns of insomnia at visit today.    Denies HA, CP, SOB, dizziness, palpitations, visual changes, and lower extremity swelling.  Relevant past medical, surgical, family and social history reviewed and updated as indicated. Interim medical history since our last visit reviewed. Allergies and medications reviewed and updated.  Review of Systems  Eyes: Negative for visual disturbance.  Respiratory: Negative for cough, chest tightness and shortness of breath.   Cardiovascular: Negative for chest pain, palpitations and leg swelling.  Neurological: Negative for dizziness and headaches.  Psychiatric/Behavioral: Positive for sleep disturbance. The patient is nervous/anxious.     Per HPI unless specifically indicated above     Objective:    BP 128/77   Pulse 87   Temp (!) 97 F (36.1 C)   Wt 131 lb 2 oz (59.5 kg)   SpO2 99%   BMI 23.08 kg/m   Wt Readings from Last 3 Encounters:  12/27/20 131 lb 2 oz (59.5 kg)  08/22/20 124 lb 3.2 oz (56.3 kg)  05/21/20 128 lb (58.1 kg)    Physical Exam Vitals and nursing note reviewed.  Constitutional:       General: She is not in acute distress.    Appearance: Normal appearance. She is normal weight. She is not ill-appearing, toxic-appearing or diaphoretic.  HENT:     Head: Normocephalic.     Right Ear: External ear normal.     Left Ear: External ear normal.     Nose: Nose normal.     Mouth/Throat:     Mouth: Mucous membranes are moist.     Pharynx: Oropharynx is clear.  Eyes:     General:        Right eye: No discharge.        Left eye: No discharge.     Extraocular Movements: Extraocular movements intact.     Conjunctiva/sclera: Conjunctivae normal.     Pupils: Pupils are equal, round, and reactive to light.  Cardiovascular:     Rate and Rhythm: Normal rate and regular rhythm.     Heart sounds: No murmur heard.   Pulmonary:     Effort: Pulmonary effort is normal. No respiratory distress.     Breath sounds: Decreased breath sounds present. No wheezing or rales.  Musculoskeletal:     Cervical back: Normal range of motion and neck supple.  Skin:    General: Skin is warm and dry.     Capillary Refill: Capillary refill takes less than 2 seconds.  Neurological:  General: No focal deficit present.     Mental Status: She is alert and oriented to person, place, and time. Mental status is at baseline.  Psychiatric:        Mood and Affect: Mood normal.        Behavior: Behavior normal.        Thought Content: Thought content normal.        Judgment: Judgment normal.     Results for orders placed or performed in visit on 08/22/20  Thyroid Panel With TSH  Result Value Ref Range   TSH 2.130 0.450 - 4.500 uIU/mL   T4, Total 5.7 4.5 - 12.0 ug/dL   T3 Uptake Ratio 29 24 - 39 %   Free Thyroxine Index 1.7 1.2 - 4.9  VITAMIN D 25 Hydroxy (Vit-D Deficiency, Fractures)  Result Value Ref Range   Vit D, 25-Hydroxy 17.5 (L) 30.0 - 100.0 ng/mL      Assessment & Plan:   Problem List Items Addressed This Visit      Cardiovascular and Mediastinum   Aortic atherosclerosis ([place]) -  Primary    Chronic.  Controlled.  Labs ordered today.  Cardiac risk score discussed with patient at visit today. Recommend starting a Statin due to risk score of 14.6%.      Relevant Orders   Comp Met (CMET)   Lipid Profile     Respiratory   Emphysema, unspecified ([place])    Uncontrolled.  Patient has stopped smoking.  Spirometry done in office today. Will start Spiriva to help with patient's symptoms.  Discussed how to properly use medication. Discussed decreasing use of albuterol and that is should be used PRN.      Relevant Medications   Tiotropium Bromide Monohydrate (SPIRIVA RESPIMAT) 2.5 MCG/ACT AERS   Other Relevant Orders   Comp Met (CMET)   Lipid Profile   Spirometry with graph (Completed)     Genitourinary   Chronic kidney disease, stage 3a ([facility])    Chronic.  Stable.  Continue with current medication regimen.  Will adjust medications as needed. Labs ordered today.       Relevant Orders   Comp Met (CMET)   Lipid Profile     Other   Anxiety    Chronic.  Controlled with current medication regimen.  Discussed alprazolam usage for anxiety and potential for side effects.  Pt aware of risks of psychoactive medication use to include increased sedation, respiratory suppression, falls, extrapyramidal movements,  dependence and cardiovascular events.  PT would like to continue treatment as benefit determined to outweigh risk. Controlled substance agreement and UDS obtained today. Follow up in 3 months.      Relevant Medications   ALPRAZolam (XANAX) 0.5 MG tablet   amitriptyline (ELAVIL) 50 MG tablet   Insomnia    Doing well on amitriptyline. Has decent sleep hygiene. Refill sent. F/u in 3 months.      Controlled substance agreement signed    Controlled substance agreement signed and UDS obtained in office today.       Relevant Orders   [id number] 11+Oxyco+Alc+Crt-Bund    Other Visit Diagnoses    Long-term current use of high risk medication other than anticoagulant        Relevant Orders   [id number] 11+Oxyco+Alc+Crt-Bund       Follow up plan: Return in about 3 months (around 03/28/2021) for COPD, Anxiety and Insomnia.

## 2020-12-27 NOTE — Assessment & Plan Note (Addendum)
Uncontrolled.  Patient has stopped smoking.  Spirometry done in office today. Will start Spiriva to help with patient's symptoms.  Discussed how to properly use medication. Discussed decreasing use of albuterol and that is should be used PRN.

## 2020-12-27 NOTE — Assessment & Plan Note (Addendum)
Chronic.  Controlled with current medication regimen.  Discussed alprazolam usage for anxiety and potential for side effects.  Pt aware of risks of psychoactive medication use to include increased sedation, respiratory suppression, falls, extrapyramidal movements,  dependence and cardiovascular events.  PT would like to continue treatment as benefit determined to outweigh risk. Controlled substance agreement and UDS obtained today. Follow up in 3 months.

## 2020-12-27 NOTE — Assessment & Plan Note (Signed)
Chronic.  Controlled.  Labs ordered today.  Cardiac risk score discussed with patient at visit today. Recommend starting a Statin due to risk score of 14.6%.

## 2020-12-27 NOTE — Assessment & Plan Note (Signed)
Controlled substance agreement signed and UDS obtained in office today.

## 2020-12-27 NOTE — Addendum Note (Signed)
Addended by: Gerrit Halls L on: 12/27/2020 02:52 PM   Modules accepted: Orders

## 2020-12-27 NOTE — Assessment & Plan Note (Signed)
Chronic.  Stable.  Continue with current medication regimen.  Will adjust medications as needed. Labs ordered today.

## 2020-12-28 ENCOUNTER — Telehealth: Payer: Self-pay

## 2020-12-28 LAB — COMPREHENSIVE METABOLIC PANEL
ALT: 13 IU/L (ref 0–32)
AST: 15 IU/L (ref 0–40)
Albumin/Globulin Ratio: 1.8 (ref 1.2–2.2)
Albumin: 4.3 g/dL (ref 3.8–4.8)
Alkaline Phosphatase: 113 IU/L (ref 44–121)
BUN/Creatinine Ratio: 9 — ABNORMAL LOW (ref 12–28)
BUN: 10 mg/dL (ref 8–27)
Bilirubin Total: <0.2 mg/dL (ref 0.0–1.2)
CO2: 24 mmol/L (ref 20–29)
Calcium: 9.1 mg/dL (ref 8.7–10.3)
Chloride: 102 mmol/L (ref 96–106)
Creatinine, Ser: 1.17 mg/dL — ABNORMAL HIGH (ref 0.57–1.00)
Globulin, Total: 2.4 g/dL (ref 1.5–4.5)
Glucose: 83 mg/dL (ref 65–99)
Potassium: 4.2 mmol/L (ref 3.5–5.2)
Sodium: 142 mmol/L (ref 134–144)
Total Protein: 6.7 g/dL (ref 6.0–8.5)
eGFR: 51 mL/min/{1.73_m2} — ABNORMAL LOW (ref >59)

## 2020-12-28 LAB — LIPID PANEL
Chol/HDL Ratio: 4.2 ratio (ref 0.0–4.4)
Cholesterol, Total: 233 mg/dL — ABNORMAL HIGH (ref 100–199)
HDL: 56 mg/dL (ref >39)
LDL Chol Calc (NIH): 135 mg/dL — ABNORMAL HIGH (ref 0–99)
Triglycerides: 235 mg/dL — ABNORMAL HIGH (ref 0–149)
VLDL Cholesterol Cal: 42 mg/dL — ABNORMAL HIGH (ref 5–40)

## 2020-12-28 NOTE — Progress Notes (Signed)
Please let [name] know that her kidney function remains stable.  We will continue to monitor this at future visits.  Her Cholesterol is elevated from prior.  As discussed at her visit her Cardiac risk score is in the high risk category.  I do recommend she start a Statin.  We can start Crestor 5mg  and increase the dose as she tolerates it.  If patient agrees I can send this to the pharmacy for her.  We will continue to check cholesterol at future visits. Please let me know if she has any questions.

## 2020-12-28 NOTE — Telephone Encounter (Signed)
Called patient to confirm pharmacy information. Left a vm for patient to return my call with the information.

## 2020-12-31 NOTE — Telephone Encounter (Signed)
Called patient to confirm pharmacy information. Left a vm for patient to return my call with the information.  

## 2021-01-01 MED ORDER — BUDESONIDE-FORMOTEROL FUMARATE 160-4.5 MCG/ACT IN AERO: 2.0000 | INHALATION_SPRAY | Freq: Two times a day (BID) | RESPIRATORY_TRACT | 3 refills | Status: DC

## 2021-01-01 MED ORDER — ROSUVASTATIN CALCIUM 5 MG PO TABS: 5.0000 mg | ORAL_TABLET | Freq: Every day | ORAL | 1 refills | Status: DC

## 2021-01-01 NOTE — Addendum Note (Signed)
Addended by: Jon Billings on: 01/01/2021 07:43 AM   Modules accepted: Orders

## 2021-01-01 NOTE — Progress Notes (Signed)
I changed the inhaler to Symbicort. Please let me know if this is also expensive.  I also sent the Crestor to the pharmacy.

## 2021-01-04 NOTE — Telephone Encounter (Signed)
Called patient to confirm pharmacy information. Left a vm for patient to return my call with the information.  Unable to reach patient after multiple attempts, will close this encounter.

## 2021-01-11 LAB — BENZODIAZEPINES CONFIRM, URINE
Alprazolam Conf.: 409 ng/mL
Alprazolam: POSITIVE — AB
Benzodiazepines: POSITIVE ng/mL — AB
Clonazepam: NEGATIVE
Flurazepam: NEGATIVE
Lorazepam: NEGATIVE
Midazolam: NEGATIVE
Nordiazepam: NEGATIVE
Oxazepam: NEGATIVE
Temazepam: NEGATIVE
Triazolam: NEGATIVE

## 2021-01-11 LAB — DRUG SCREEN 764883 11+OXYCO+ALC+CRT-BUND
Amphetamines, Urine: NEGATIVE ng/mL
Barbiturate: NEGATIVE ng/mL
Cocaine (Metabolite): NEGATIVE ng/mL
Creatinine: 104.4 mg/dL (ref 20.0–300.0)
Ethanol: NEGATIVE %
Meperidine: NEGATIVE ng/mL
Methadone Screen, Urine: NEGATIVE ng/mL
OPIATE SCREEN URINE: NEGATIVE ng/mL
Oxycodone/Oxymorphone, Urine: NEGATIVE ng/mL
Phencyclidine: NEGATIVE ng/mL
Propoxyphene: NEGATIVE ng/mL
Tramadol: NEGATIVE ng/mL
pH, Urine: 5.3 (ref 4.5–8.9)

## 2021-01-11 LAB — CANNABINOID CONFIRMATION, UR
CANNABINOIDS: POSITIVE — AB
Carboxy THC GC/MS Conf: 78 ng/mL

## 2021-01-11 NOTE — Progress Notes (Signed)
Please let patient know her drug screen showed Xanax as expected but also showed marijuana.  I advise that she not use them together in the future.  Please let me know if she has any questions.

## 2021-01-16 ENCOUNTER — Telehealth: Payer: Self-pay | Admitting: *Deleted

## 2021-01-16 NOTE — Telephone Encounter (Signed)
Reviewed results and provider's note with the patient. Emphasized the concern with taking Xanax and smoking marijuana together. Patient agreed she would not do this together in the future.

## 2021-01-18 ENCOUNTER — Telehealth: Payer: Self-pay | Admitting: Nurse Practitioner

## 2021-01-18 NOTE — Telephone Encounter (Signed)
Can we find out what this patient is asking for?

## 2021-01-18 NOTE — Telephone Encounter (Signed)
Calling regarding the patient's medication.  Checked her list and there were no current meds. Listed.  Please advise and call to discuss at [phone]

## 2021-01-24 NOTE — Telephone Encounter (Signed)
Patient called in to return a call she had missed, she states she is already aware of the results and is tired of the continue calls about the same thing. Pt states she will be looking into finding another PCP.

## 2021-01-26 ENCOUNTER — Other Ambulatory Visit: Payer: Self-pay | Admitting: Nurse Practitioner

## 2021-02-04 ENCOUNTER — Telehealth: Payer: 59

## 2021-02-04 NOTE — Telephone Encounter (Signed)
Paper placed in your signature folder for review, alternate medication requested.

## 2021-02-05 ENCOUNTER — Telehealth: Payer: Self-pay

## 2021-02-05 NOTE — Telephone Encounter (Signed)
Called pt to get more information no answer left vm   Copied from [place] [id number]. Topic: General - Other >> Feb 05, 2021 11:08 AM Keene Breath wrote: Reason for CRM: Patient would like the nurse to call her regarding bringing in a urine sample.  Patient said she did not need to see the doctor but just wanted to bring in a urine sample.  Please advise and call patient to discuss at [phone]

## 2021-02-05 NOTE — Telephone Encounter (Signed)
Pt presented in office states that she wants to have a drug test done to clear her name. Pt is wanting to see about seeing another provider due to not liking pcp's demeanor and states that she made a big ordeal about her recent lab and now the drug store and insurance company knows and is treating her differently. Pt states that she thinks pcp is too blunt for her. Spoke with Santiago Glad she states that she never told pt that she wouldn't give her anymore xanax I explained this to pt and she would still like to see about seeing someone else. Please advise.

## 2021-02-08 NOTE — Telephone Encounter (Signed)
Form faxed

## 2021-03-28 ENCOUNTER — Ambulatory Visit: Payer: 59 | Admitting: Nurse Practitioner

## 2021-04-06 ENCOUNTER — Other Ambulatory Visit: Payer: Self-pay | Admitting: Nurse Practitioner

## 2021-04-06 NOTE — Telephone Encounter (Signed)
last RF 01/01/21 #90 1 RF

## 2021-04-09 ENCOUNTER — Ambulatory Visit: Payer: Self-pay | Admitting: Nurse Practitioner

## 2021-04-15 ENCOUNTER — Ambulatory Visit (INDEPENDENT_AMBULATORY_CARE_PROVIDER_SITE_OTHER): Payer: Medicare HMO | Admitting: Family Medicine

## 2021-04-15 ENCOUNTER — Other Ambulatory Visit: Payer: Self-pay

## 2021-04-15 ENCOUNTER — Encounter: Payer: Self-pay | Admitting: Family Medicine

## 2021-04-15 VITALS — BP 158/86 | HR 85 | Temp 98.0°F | Wt 132.4 lb

## 2021-04-15 DIAGNOSIS — F419 Anxiety disorder, unspecified: Secondary | ICD-10-CM

## 2021-04-15 DIAGNOSIS — R69 Illness, unspecified: Secondary | ICD-10-CM | POA: Diagnosis not present

## 2021-04-15 DIAGNOSIS — Z1239 Encounter for other screening for malignant neoplasm of breast: Secondary | ICD-10-CM | POA: Diagnosis not present

## 2021-04-15 MED ORDER — AMITRIPTYLINE HCL 50 MG PO TABS: 100.0000 mg | ORAL_TABLET | Freq: Every day | ORAL | 1 refills | Status: DC

## 2021-04-15 MED ORDER — ALPRAZOLAM 0.5 MG PO TABS: ORAL_TABLET | ORAL | 2 refills | Status: DC

## 2021-04-15 NOTE — Assessment & Plan Note (Signed)
Under good control on current regimen. Continue current regimen. Continue to monitor. Call with any concerns. Refills given for 3 months. Follow up 3 months.    

## 2021-04-15 NOTE — Progress Notes (Signed)
BP (!) 158/86 (BP Location: Left Arm, Cuff Size: Normal)   Pulse 85   Temp 98 F (36.7 C) (Oral)   Wt 132 lb 6.4 oz (60.1 kg)   SpO2 98%   BMI 23.31 kg/m    Subjective:    Patient ID: Jordan Cantu, female    DOB: 08-28-1951, 70 y.o.   MRN: [id number]  HPI: Kalliopi Coupland is a 70 y.o. female  Chief Complaint  Patient presents with   Anxiety   COPD   ANXIETY/STRESS- has gotten scammed on her credit card.  Duration: chronic Status:controlled Anxious mood: yes  Excessive worrying: yes Irritability: yes  Sweating: no Nausea: no Palpitations:no Hyperventilation: no Panic attacks: yes Agoraphobia: no  Obscessions/compulsions: no Depressed mood: no Depression screen [facility] 2/9 04/15/2021 12/27/2020 08/22/2020 05/21/2020 02/15/2020  Decreased Interest 0 0 0 0 1  Down, Depressed, Hopeless 0 0 3 0 0  PHQ - 2 Score 0 0 3 0 1  Altered sleeping 0 0 0 0 -  Tired, decreased energy 0 0 0 0 -  Change in appetite 0 0 3 0 -  Feeling bad or failure about yourself  0 0 0 0 -  Trouble concentrating 0 0 0 0 -  Moving slowly or fidgety/restless 0 0 0 0 -  Suicidal thoughts 0 0 0 0 -  PHQ-9 Score 0 0 6 0 -  Difficult doing work/chores Not difficult at all Not difficult at all Somewhat difficult Not difficult at all -  Some recent data might be hidden   GAD 7 : Generalized Anxiety Score 04/15/2021 08/22/2020 05/21/2020 02/13/2020  Nervous, Anxious, on Edge 3 0 0 1  Control/stop worrying 3 0 1 1  Worry too much - different things 3 0 1 1  Trouble relaxing 0 0 1 1  Restless 1 1 1  0  Easily annoyed or irritable 0 0 0 1  Afraid - awful might happen 0 0 0 2  Total GAD 7 Score 10 1 4 7   Anxiety Difficulty Not difficult at all Not difficult at all - Not difficult at all   Anhedonia: no Weight changes: no Insomnia: yes hard to stay asleep  Hypersomnia: no Fatigue/loss of energy: no Feelings of worthlessness: no Feelings of guilt: no Impaired concentration/indecisiveness:  no Suicidal ideations: no  Crying spells: yes Recent Stressors/Life Changes: yes   Relationship problems: no   Family stress: no     Financial stress: yes    Job stress: no    Recent death/loss: no   Relevant past medical, surgical, family and social history reviewed and updated as indicated. Interim medical history since our last visit reviewed. Allergies and medications reviewed and updated.  Review of Systems  Constitutional: Negative.   Respiratory: Negative.    Cardiovascular: Negative.   Gastrointestinal: Negative.   Musculoskeletal: Negative.   Skin: Negative.   Psychiatric/Behavioral:  Negative for agitation, behavioral problems, confusion, decreased concentration, dysphoric mood, hallucinations, self-injury, sleep disturbance and suicidal ideas. The patient is nervous/anxious. The patient is not hyperactive.    Per HPI unless specifically indicated above     Objective:    BP (!) 158/86 (BP Location: Left Arm, Cuff Size: Normal)   Pulse 85   Temp 98 F (36.7 C) (Oral)   Wt 132 lb 6.4 oz (60.1 kg)   SpO2 98%   BMI 23.31 kg/m   Wt Readings from Last 3 Encounters:  04/15/21 132 lb 6.4 oz (60.1 kg)  12/27/20 131 lb 2 oz (59.5  kg)  08/22/20 124 lb 3.2 oz (56.3 kg)    Physical Exam Vitals and nursing note reviewed.  Constitutional:      General: She is not in acute distress.    Appearance: Normal appearance. She is not ill-appearing, toxic-appearing or diaphoretic.  HENT:     Head: Normocephalic and atraumatic.     Right Ear: External ear normal.     Left Ear: External ear normal.     Nose: Nose normal.     Mouth/Throat:     Mouth: Mucous membranes are moist.     Pharynx: Oropharynx is clear.  Eyes:     General: No scleral icterus.       Right eye: No discharge.        Left eye: No discharge.     Extraocular Movements: Extraocular movements intact.     Conjunctiva/sclera: Conjunctivae normal.     Pupils: Pupils are equal, round, and reactive to light.   Cardiovascular:     Rate and Rhythm: Normal rate and regular rhythm.     Pulses: Normal pulses.     Heart sounds: Normal heart sounds. No murmur heard.   No friction rub. No gallop.  Pulmonary:     Effort: Pulmonary effort is normal. No respiratory distress.     Breath sounds: Normal breath sounds. No stridor. No wheezing, rhonchi or rales.  Chest:     Chest wall: No tenderness.  Musculoskeletal:        General: Normal range of motion.     Cervical back: Normal range of motion and neck supple.  Skin:    General: Skin is warm and dry.     Capillary Refill: Capillary refill takes less than 2 seconds.     Coloration: Skin is not jaundiced or pale.     Findings: No bruising, erythema, lesion or rash.  Neurological:     General: No focal deficit present.     Mental Status: She is alert and oriented to person, place, and time. Mental status is at baseline.  Psychiatric:        Mood and Affect: Mood normal.        Behavior: Behavior normal.        Thought Content: Thought content normal.        Judgment: Judgment normal.    Results for orders placed or performed in visit on 12/27/20  Comp Met (CMET)  Result Value Ref Range   Glucose 83 65 - 99 mg/dL   BUN 10 8 - 27 mg/dL   Creatinine, Ser 1.17 (H) 0.57 - 1.00 mg/dL   eGFR 51 (L) >59 mL/min/1.73   BUN/Creatinine Ratio 9 (L) 12 - 28   Sodium 142 134 - 144 mmol/L   Potassium 4.2 3.5 - 5.2 mmol/L   Chloride 102 96 - 106 mmol/L   CO2 24 20 - 29 mmol/L   Calcium 9.1 8.7 - 10.3 mg/dL   Total Protein 6.7 6.0 - 8.5 g/dL   Albumin 4.3 3.8 - 4.8 g/dL   Globulin, Total 2.4 1.5 - 4.5 g/dL   Albumin/Globulin Ratio 1.8 1.2 - 2.2   Bilirubin Total <0.2 0.0 - 1.2 mg/dL   Alkaline Phosphatase 113 44 - 121 IU/L   AST 15 0 - 40 IU/L   ALT 13 0 - 32 IU/L  Lipid Profile  Result Value Ref Range   Cholesterol, Total 233 (H) 100 - 199 mg/dL   Triglycerides 235 (H) 0 - 149 mg/dL   HDL 56 >39 mg/dL  VLDL Cholesterol Cal 42 (H) 5 - 40 mg/dL    LDL Chol Calc (NIH) 135 (H) 0 - 99 mg/dL   Chol/HDL Ratio 4.2 0.0 - 4.4 ratio  [id number] 11+Oxyco+Alc+Crt-Bund  Result Value Ref Range   Ethanol Negative Cutoff=0.020 %   Amphetamines, Urine Negative Cutoff=1000 ng/mL   Barbiturate Negative Cutoff=200 ng/mL   BENZODIAZ UR QL See Final Results Cutoff=200 ng/mL   Cannabinoid Quant, Ur See Final Results Cutoff=50 ng/mL   Cocaine (Metabolite) Negative Cutoff=300 ng/mL   OPIATE SCREEN URINE Negative Cutoff=300 ng/mL   Oxycodone/Oxymorphone, Urine Negative Cutoff=300 ng/mL   Phencyclidine Negative Cutoff=25 ng/mL   Methadone Screen, Urine Negative Cutoff=300 ng/mL   Propoxyphene Negative Cutoff=300 ng/mL   Meperidine Negative Cutoff=200 ng/mL   Tramadol Negative Cutoff=200 ng/mL   Creatinine 104.4 20.0 - 300.0 mg/dL   pH, Urine 5.3 4.5 - 8.9  Benzodiazepines Confirm, Urine  Result Value Ref Range   Benzodiazepines Positive (A) Cutoff=100 ng/mL   Nordiazepam Negative Cutoff=100   Oxazepam Negative Cutoff=100   Flurazepam Negative Cutoff=100   Lorazepam Negative Cutoff=100   Alprazolam Positive (A)    Alprazolam Conf. 409 Cutoff=100 ng/mL   Clonazepam Negative Cutoff=100   Temazepam Negative Cutoff=100   Triazolam Negative Cutoff=100   Midazolam Negative Cutoff=100  Cannabinoid Conf, Ur  Result Value Ref Range   CANNABINOIDS Positive (A) Cutoff=50   Carboxy THC GC/MS Conf 78 Cutoff=15 ng/mL      Assessment & Plan:   Problem List Items Addressed This Visit       Other   Anxiety    Under good control on current regimen. Continue current regimen. Continue to monitor. Call with any concerns. Refills given for 3 months. Follow up 3 months.        Relevant Medications   amitriptyline (ELAVIL) 50 MG tablet   ALPRAZolam (XANAX) 0.5 MG tablet   Other Visit Diagnoses     Encounter for screening breast examination    -  Primary   Relevant Orders   MM 3D SCREEN BREAST BILATERAL        Follow up plan: Return in about 3 months  (around 07/14/2021) for before 11/13 for physical with PCP.

## 2021-05-08 ENCOUNTER — Other Ambulatory Visit: Payer: Self-pay | Admitting: Nurse Practitioner

## 2021-05-23 ENCOUNTER — Telehealth: Payer: Self-pay | Admitting: Nurse Practitioner

## 2021-05-23 NOTE — Telephone Encounter (Signed)
Copied from [place] [id number]. Topic: Medicare AWV >> May 23, 2021 11:36 AM Jordan Cantu wrote: Left message for patient to call back and schedule the Medicare Annual Wellness Visit (AWV) virtually or by telephone.  Last AWV 02/15/20  Please schedule at anytime with [facility] Health Advisor.  45 minute appointment  Any questions, please call me at [phone]  Reason for CRM:

## 2021-05-28 ENCOUNTER — Telehealth: Payer: Self-pay

## 2021-05-28 NOTE — Telephone Encounter (Signed)
Copied from [place] [id number]. Topic: General - Inquiry >> May 28, 2021  3:35 PM Greggory Keen D wrote: Reason for CRM: Pt called saying her insurance has changed and she is going to have a phone visit on 05/31/21/  She said her new ins was [place]  [id number] Group [id number]  CB#  [phone]   Routing to admin to add into chart.

## 2021-05-31 ENCOUNTER — Ambulatory Visit (INDEPENDENT_AMBULATORY_CARE_PROVIDER_SITE_OTHER): Payer: Medicare Other

## 2021-05-31 DIAGNOSIS — Z0000 Encounter for general adult medical examination without abnormal findings: Secondary | ICD-10-CM | POA: Diagnosis not present

## 2021-05-31 NOTE — Progress Notes (Signed)
Subjective:   Jordan Cantu is a 70 y.o. female who presents for Medicare Annual (Subsequent) preventive examination.  I connected with  Jordan Cantu on 05/31/21 by an audio only telemedicine application and verified that I am speaking with the correct person using two identifiers.   I discussed the limitations, risks, security and privacy concerns of performing an evaluation and management service by telephone and the availability of in person appointments. I also discussed with the patient that there may be a patient responsible charge related to this service. The patient expressed understanding and verbally consented to this telephonic visit.  Location of Patient: Home  Location of Provider: Office   List any persons and their role that are participating in the visit with the patient.   Review of Systems    Defer to PCP       Objective:    There were no vitals filed for this visit. There is no height or weight on file to calculate BMI.  Advanced Directives 02/15/2020 01/10/2019 03/11/2018 03/08/2018 12/28/2017  Does Patient Have a Medical Advance Directive? No No No No Yes  Does patient want to make changes to medical advance directive? - - - - Yes (MAU/Ambulatory/Procedural Areas - Information given)  Would patient like information on creating a medical advance directive? Yes (MAU/Ambulatory/Procedural Areas - Information given) Yes (MAU/Ambulatory/Procedural Areas - Information given) No - Patient declined Yes (MAU/Ambulatory/Procedural Areas - Information given) -    Current Medications (verified) Outpatient Encounter Medications as of 05/31/2021  Medication Sig   albuterol (PROVENTIL) (2.5 MG/3ML) 0.083% nebulizer solution Take 3 mLs (2.5 mg total) by nebulization every 6 (six) hours as needed for wheezing or shortness of breath.   albuterol (VENTOLIN HFA) 108 (90 Base) MCG/ACT inhaler INHALE 2 PUFFS INTO THE LUNGS EVERY 6 HOURS AS NEEDED FOR WHEEZING OR  SHORTNESS OF BREATH   ALPRAZolam (XANAX) 0.5 MG tablet TAKE 1 TABLET(0.5 MG) BY MOUTH THREE TIMES DAILY AS NEEDED FOR ANXIETY   amitriptyline (ELAVIL) 50 MG tablet Take 2 tablets (100 mg total) by mouth at bedtime. Take 2 pills nightly for sleep.   rosuvastatin (CRESTOR) 5 MG tablet Take 1 tablet (5 mg total) by mouth daily.   SYMBICORT 160-4.5 MCG/ACT inhaler INHALE 2 PUFFS INTO THE LUNGS TWICE DAILY   calcium-vitamin D (OSCAL WITH D) 500-200 MG-UNIT tablet Take 2 tablets by mouth daily with breakfast. (Patient not taking: No sig reported)   No facility-administered encounter medications on file as of 05/31/2021.    Allergies (verified) Bupropion   History: Past Medical History:  Diagnosis Date   Anxiety    Depression    Emphysema, unspecified ([place])    Insomnia    Tobacco use 12/28/2017   Wears contact lenses    Wears dentures    full upper   Past Surgical History:  Procedure Laterality Date   COLONOSCOPY WITH PROPOFOL N/A 03/08/2018   Procedure: COLONOSCOPY WITH PROPOFOL;  Surgeon: Lucilla Lame, MD;  Location: [facility];  Service: Endoscopy;  Laterality: N/A;   NOSE SURGERY  2016   cauterization   POLYPECTOMY N/A 03/08/2018   Procedure: POLYPECTOMY;  Surgeon: Lucilla Lame, MD;  Location: [facility];  Service: Endoscopy;  Laterality: N/A;   TOOTH EXTRACTION     WISDOM TOOTH EXTRACTION     Family History  Problem Relation Age of Onset   Pulmonary fibrosis Mother    Depression Mother    Diabetes Father    Lung disease Father  Coronary artery disease Maternal Grandfather    Diabetes Maternal Grandfather    Breast cancer Maternal Aunt    Breast cancer Maternal Aunt    Lung cancer Maternal Uncle    Lung cancer Paternal Aunt    Breast cancer Cousin        mat cousin   Cancer Sister        lung   Social History   Socioeconomic History   Marital status: Legally Separated    Spouse name: Not on file   Number of children: Not on file   Years of  education: Not on file   Highest education level: High school graduate  Occupational History   Occupation: retired  Tobacco Use   Smoking status: Some Days    Packs/day: 0.25    Years: 50.00    Pack years: 12.50    Types: Cigarettes    Last attempt to quit: 03/11/2021    Years since quitting: 0.2    Passive exposure: Past   Smokeless tobacco: Never  Vaping Use   Vaping Use: Never used  Substance and Sexual Activity   Alcohol use: Yes    Alcohol/week: 3.0 standard drinks    Types: 3 Cans of beer per week    Comment:     Drug use: Yes    Types: Marijuana   Sexual activity: Not Currently  Other Topics Concern   Not on file  Social History Narrative   parttime caregiver for 70 year old   Social Determinants of Health   Financial Resource Strain: Medium Risk   Difficulty of Paying Living Expenses: Somewhat hard  Food Insecurity: Landscape architect Present   Worried About Charity fundraiser in the Last Year: Sometimes true   Arboriculturist in the Last Year: Sometimes true  Transportation Needs: No Transportation Needs   Lack of Transportation (Medical): No   Lack of Transportation (Non-Medical): No  Physical Activity: Insufficiently Active   Days of Exercise per Week: 1 day   Minutes of Exercise per Session: 10 min  Stress: No Stress Concern Present   Feeling of Stress : Not at all  Social Connections: Moderately Isolated   Frequency of Communication with Friends and Family: More than three times a week   Frequency of Social Gatherings with Friends and Family: Once a week   Attends Religious Services: Never   Marine scientist or Organizations: Yes   Attends Archivist Meetings: 1 to 4 times per year   Marital Status: Divorced    Tobacco Counseling Ready to quit: Not Answered Counseling given: Not Answered   Clinical Intake:                 Diabetic?No         Activities of Daily Living In your present state of health, do you have  any difficulty performing the following activities: 05/31/2021 12/27/2020  Hearing? N N  Vision? Y N  Difficulty concentrating or making decisions? N N  Walking or climbing stairs? N N  Dressing or bathing? N N  Doing errands, shopping? N N  Preparing Food and eating ? N -  Using the Toilet? N -  In the past six months, have you accidently leaked urine? N -  Do you have problems with loss of bowel control? N -  Managing your Medications? N -  Managing your Finances? N -  Some recent data might be hidden    Patient Care Team: Jon Billings, NP  as PCP - General Greg Cutter, LCSW as Social Worker (Licensed Clinical Social Worker)  Indicate any recent [employer] you may have received from other than [facility] providers in the past year (date may be approximate).     Assessment:   This is a routine wellness examination for Khadija.  Hearing/Vision screen No results found.  Dietary issues and exercise activities discussed:     Goals Addressed   None    Depression Screen PHQ 2/9 Scores 05/31/2021 04/15/2021 12/27/2020 08/22/2020 05/21/2020 02/15/2020 02/13/2020  PHQ - 2 Score 0 0 0 3 0 1 1  PHQ- 9 Score - 0 0 6 0 - 5    Fall Risk Fall Risk  05/31/2021 12/27/2020 05/21/2020 02/15/2020 02/13/2020  Falls in the past year? 0 0 0 0 0  Number falls in past yr: 0 0 0 0 -  Injury with Fall? 0 0 0 0 -  Risk for fall due to : No Fall Risks - No Fall Risks - -  Follow up Falls evaluation completed - Falls evaluation completed - -    FALL RISK PREVENTION PERTAINING TO THE HOME:  Any stairs in or around the home? Yes  If so, are there any without handrails? Yes  Home free of loose throw rugs in walkways, pet beds, electrical cords, etc? No  Adequate lighting in your home to reduce risk of falls? Yes   ASSISTIVE DEVICES UTILIZED TO PREVENT FALLS:  Life alert? No  Use of a cane, walker or w/c? No  Grab bars in the bathroom? No  Shower chair or bench in shower? No  Elevated toilet  seat or a handicapped toilet? No      Cognitive Function:     6CIT Screen 05/31/2021 01/10/2019 12/28/2017  What Year? 0 points 0 points 0 points  What month? 0 points 0 points 0 points  What time? 0 points 0 points 0 points  Count back from 20 0 points 0 points 0 points  Months in reverse 0 points 0 points 0 points  Repeat phrase 0 points 0 points 0 points  Total Score 0 0 0    Immunizations Immunization History  Administered Date(s) Administered   Influenza, High Dose Seasonal PF 07/19/2018, 07/25/2019   Influenza-Unspecified 07/25/2019   PFIZER(Purple Top)SARS-COV-2 Vaccination 11/12/2019, 12/07/2019   Pneumococcal Conjugate-13 02/13/2020   Td 12/02/2012    TDAP status: Up to date  Flu Vaccine status: Due, Education has been provided regarding the importance of this vaccine. Advised may receive this vaccine at local pharmacy or Health Dept. Aware to provide a copy of the vaccination record if obtained from local pharmacy or Health Dept. Verbalized acceptance and understanding.  Pneumococcal vaccine status: Up to date  Covid-19 vaccine status: Completed vaccines  Qualifies for Shingles Vaccine? Yes   Zostavax completed No   Shingrix Completed?: No.    Education has been provided regarding the importance of this vaccine. Patient has been advised to call insurance company to determine out of pocket expense if they have not yet received this vaccine. Advised may also receive vaccine at local pharmacy or Health Dept. Verbalized acceptance and understanding.  Screening Tests Health Maintenance  Topic Date Due   COVID-19 Vaccine (3 - Pfizer risk series) 01/04/2020   MAMMOGRAM  03/06/2021   COLONOSCOPY (Pts 45-74yrs Insurance coverage will need to be confirmed)  03/08/2021   INFLUENZA VACCINE  04/01/2021   Zoster Vaccines- Shingrix (1 of 2) 07/16/2021 (Originally 07/30/1970)   TETANUS/TDAP  12/03/2022  DEXA SCAN  Completed   Hepatitis C Screening  Completed   HPV VACCINES   Aged Out    Health Maintenance  Health Maintenance Due  Topic Date Due   COVID-19 Vaccine (3 - Pfizer risk series) 01/04/2020   MAMMOGRAM  03/06/2021   COLONOSCOPY (Pts 45-41yrs Insurance coverage will need to be confirmed)  03/08/2021   INFLUENZA VACCINE  04/01/2021    Colorectal cancer screening: Type of screening: Colonoscopy. Completed 2019. Repeat every 3 years  Mammogram status: Completed 2020. Repeat every year  Bone Density status: Completed Yes. Results reflect: Bone density results: OSTEOPENIA. Repeat every 2 years.  Lung Cancer Screening: (Low Dose CT Chest recommended if Age 58-80 years, 30 pack-year currently smoking OR have quit w/in 15years.) does qualify.   Lung Cancer Screening Referral: NO  Additional Screening:  Hepatitis C Screening: does qualify; Completed 2019  Vision Screening: Recommended annual ophthalmology exams for early detection of glaucoma and other disorders of the eye. Is the patient up to date with their annual eye exam?  No  Who is the provider or what is the name of the office in which the patient attends annual eye exams? no If pt is not established with a provider, would they like to be referred to a provider to establish care? No .   Dental Screening: Recommended annual dental exams for proper oral hygiene  Community Resource Referral / Chronic Care Management: CRR required this visit?  Yes   CCM required this visit?  No      Plan:     I have personally reviewed and noted the following in the patient's chart:   Medical and social history Use of alcohol, tobacco or illicit drugs  Current medications and supplements including opioid prescriptions.  Functional ability and status Nutritional status Physical activity Advanced directives List of other physicians Hospitalizations, surgeries, and ER visits in previous 12 months Vitals Screenings to include cognitive, depression, and falls Referrals and appointments  In  addition, I have reviewed and discussed with patient certain preventive protocols, quality metrics, and best practice recommendations. A written personalized care plan for preventive services as well as general preventive health recommendations were provided to patient.     Jerelene Redden, [place]   05/31/2021   Nurse Notes: Non Face to Face 60 minutes   Ms. Duross , Thank you for taking time to come for your Medicare Wellness Visit. I appreciate your ongoing commitment to your health goals. Please review the following plan we discussed and let me know if I can assist you in the future.   These are the goals we discussed:  Goals       I want to continue to managing my anxiety/stress/depression well (pt-stated)      Current Barriers:  Limited social support Mental Health Concerns  Social Isolation Limited education about ways to improve self-care during COVID-19* Lacks knowledge of community resource: available mental health support resources within the area  Clinical Social Work Clinical Goal(s):  Over the next 90 days, client will work with SW to address concerns related to anxiety and depression management Over the next 90 days, patient will work with LCSW to address needs related to increasing self-care  Interventions: Patient interviewed and appropriate assessments performed Provided mental health counseling with regard to anxiety (mental health diagnosis or concern) Provided patient with information about what healthy self-care looks like Discussed plans with patient for ongoing care management follow up and provided patient with direct contact information for care management  team Advised patient to continue implementing healthy self-care activities into her daily routine such as walking her dog around the block, getting vitamin D, reading and focusing on self  Assisted patient/caregiver with obtaining information about health plan benefits  Patient Self Care Activities:   Attends all scheduled provider appointments Calls provider office for new concerns or questions  Initial goal documentation       Quit Smoking      Smoking cessation discussed        This is a list of the screening recommended for you and due dates:  Health Maintenance  Topic Date Due   COVID-19 Vaccine (3 - Pfizer risk series) 01/04/2020   Mammogram  03/06/2021   Colon Cancer Screening  03/08/2021   Flu Shot  04/01/2021   Zoster (Shingles) Vaccine (1 of 2) 07/16/2021*   Tetanus Vaccine  12/03/2022   DEXA scan (bone density measurement)  Completed   Hepatitis C Screening: USPSTF Recommendation to screen - Ages 68-79 yo.  Completed   HPV Vaccine  Aged Out  *Topic was postponed. The date shown is not the original due date.

## 2021-06-03 ENCOUNTER — Telehealth: Payer: Self-pay

## 2021-06-03 NOTE — Chronic Care Management (AMB) (Signed)
  Chronic Care Management   Note  06/03/2021 Name: Evelena Masci MRN: [id number] DOB: 11/06/1950  Khala Tarte is a 70 y.o. year old female who is a primary care patient of Jon Billings, NP. Jalexis Breed is currently enrolled in care management services. An additional referral for LCSW  was placed.   Follow up plan: Unsuccessful telephone outreach attempt made. A HIPAA compliant phone message was left for the patient providing contact information and requesting a return call.  The care management team will reach out to the patient again over the next 7 days.  If patient returns call to provider office, please advise to call [facility]  at [address], [place], [place], [place] [address] Direct Dial: [phone] [email or web address] Website: [email or web address]

## 2021-06-18 NOTE — Chronic Care Management (AMB) (Signed)
  Chronic Care Management   Note  06/18/2021 Name: Jordan Cantu MRN: [id number] DOB: 1951/04/09  Jordan Cantu is a 71 y.o. year old female who is a primary care patient of Jon Billings, NP. Jordan Cantu is currently enrolled in care management services. An additional referral for LCSW  was placed.   Follow up plan: Telephone appointment with care management team member scheduled for:06/28/2021  Noreene Larsson, [address], [place], [place] [address] Direct Dial: [phone] [email or web address]: [email or web address]

## 2021-06-28 ENCOUNTER — Ambulatory Visit (INDEPENDENT_AMBULATORY_CARE_PROVIDER_SITE_OTHER): Payer: Medicare Other | Admitting: Licensed Clinical Social Worker

## 2021-06-28 DIAGNOSIS — G4700 Insomnia, unspecified: Secondary | ICD-10-CM

## 2021-06-28 DIAGNOSIS — F419 Anxiety disorder, unspecified: Secondary | ICD-10-CM

## 2021-06-28 DIAGNOSIS — F331 Major depressive disorder, recurrent, moderate: Secondary | ICD-10-CM

## 2021-06-28 DIAGNOSIS — N1831 Chronic kidney disease, stage 3a: Secondary | ICD-10-CM

## 2021-06-28 IMAGING — MG DIGITAL SCREENING BILATERAL MAMMOGRAM WITH TOMO AND CAD
8 series · 9 of 24 positions shown · non-contrast
Comparison: Previous exam(s).

CLINICAL DATA: Screening.

EXAM:
DIGITAL SCREENING BILATERAL MAMMOGRAM WITH TOMO AND CAD

[L MLO synth-2D]
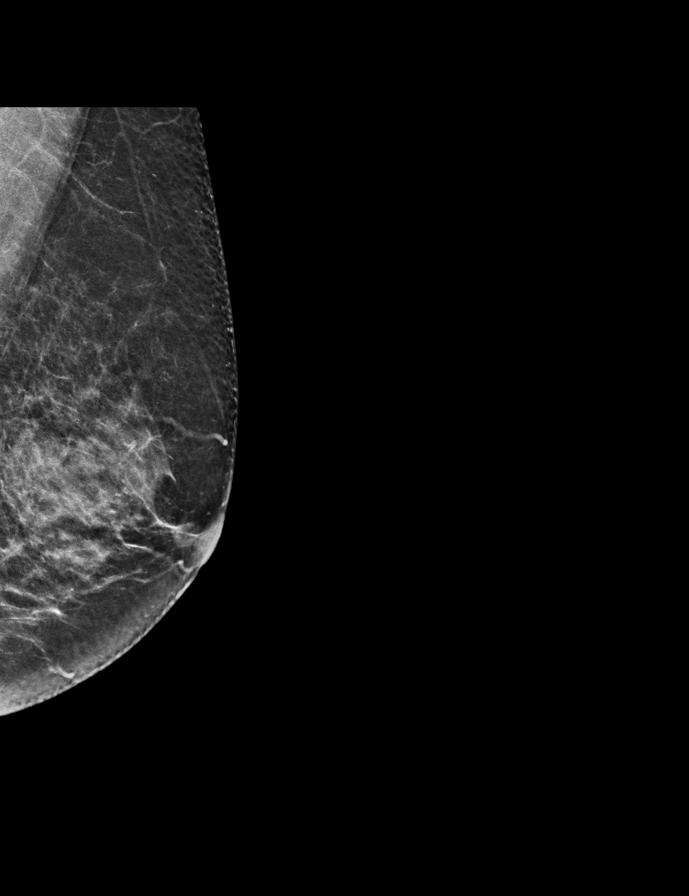

[L CC synth-2D]
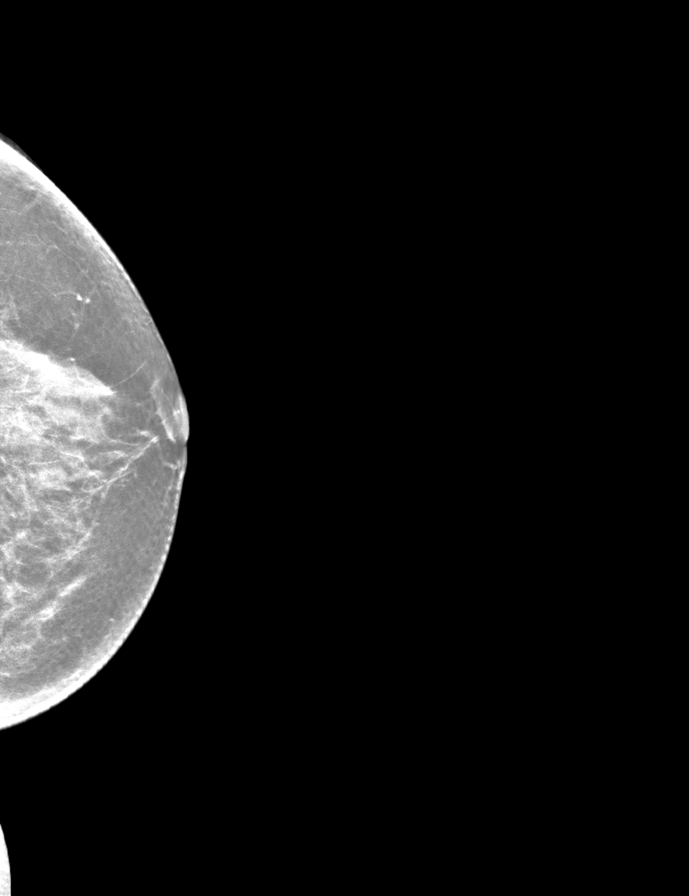

[R CC synth-2D]
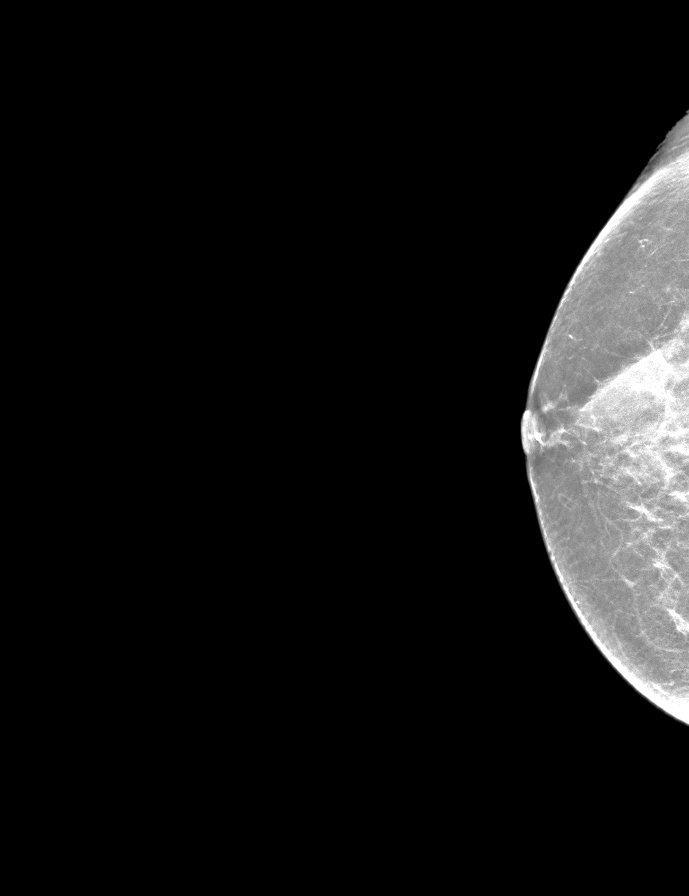

[R MLO synth-2D]
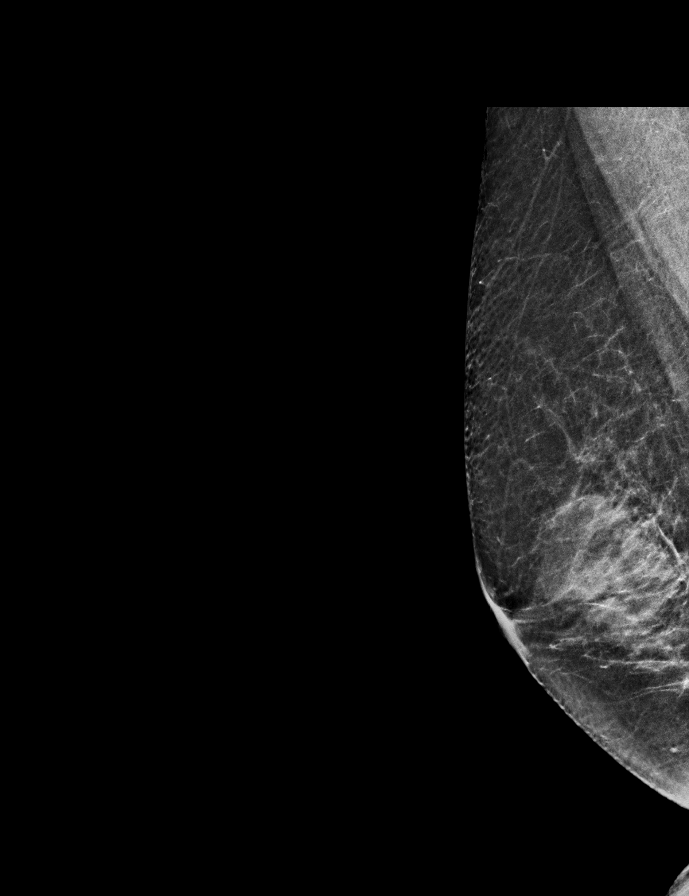

[L MLO tomo · 2 of 56 frames shown]
[frame 19/56]
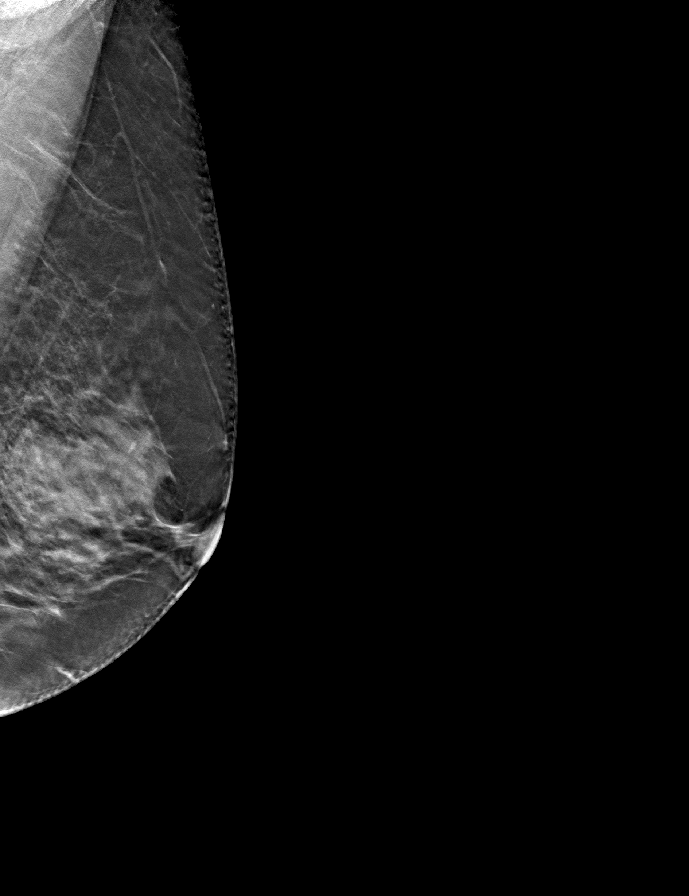
[frame 29/56]
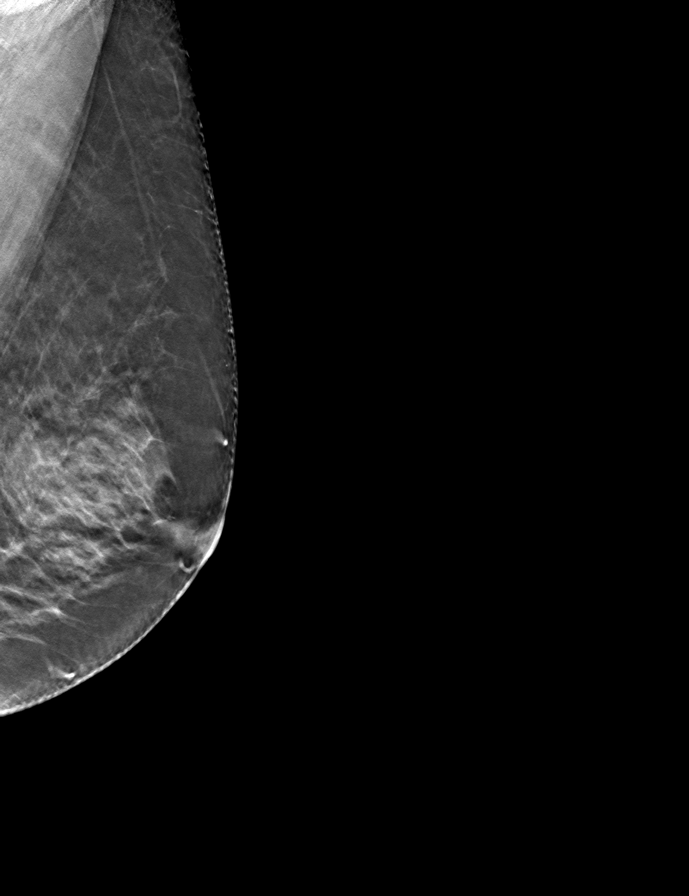

[L CC tomo · tomo slice 30/59.0]
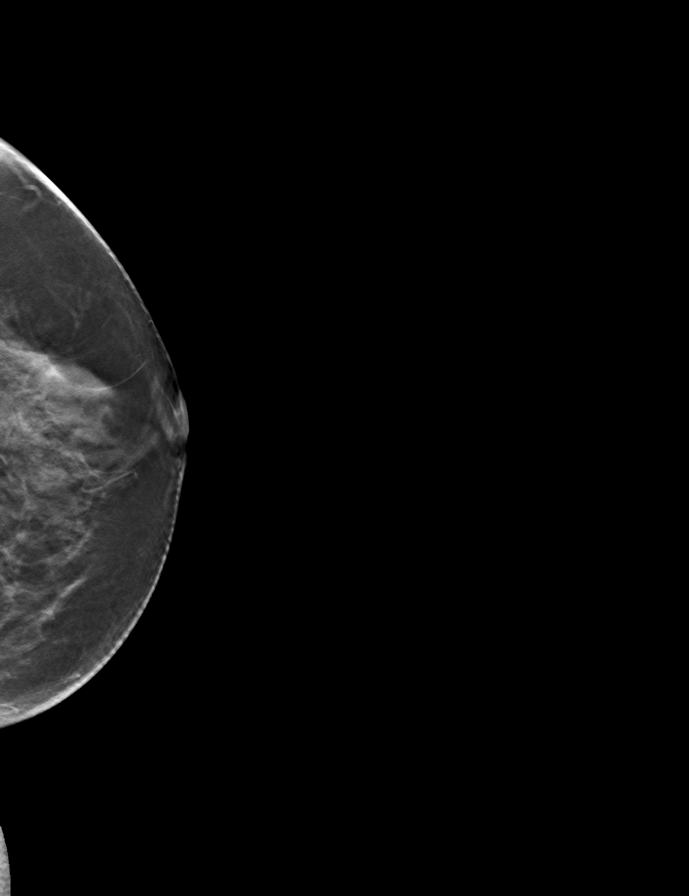

[R CC tomo · tomo slice 27/54.0]
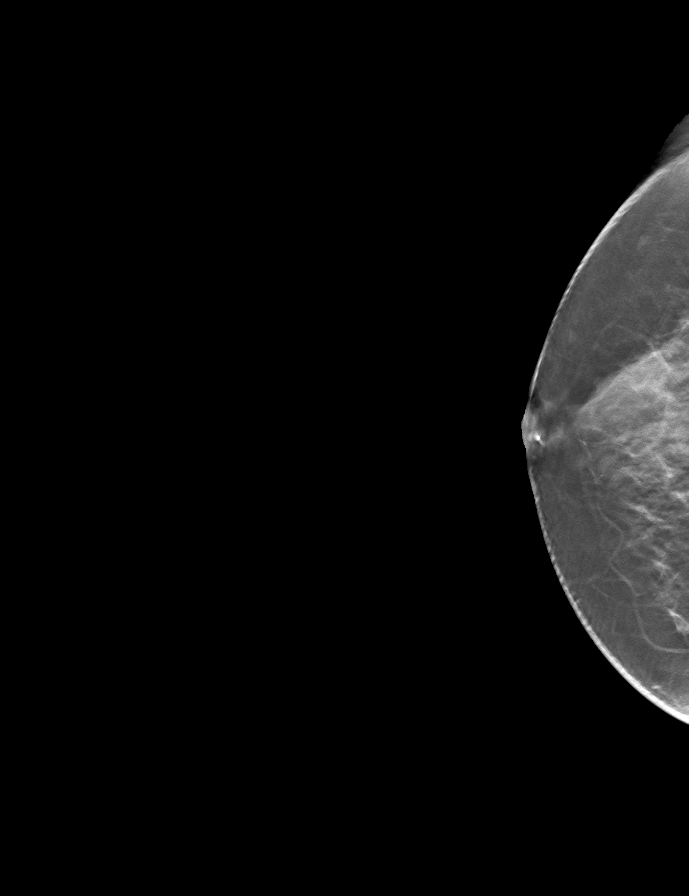

[R MLO tomo · tomo slice 28/55.0]
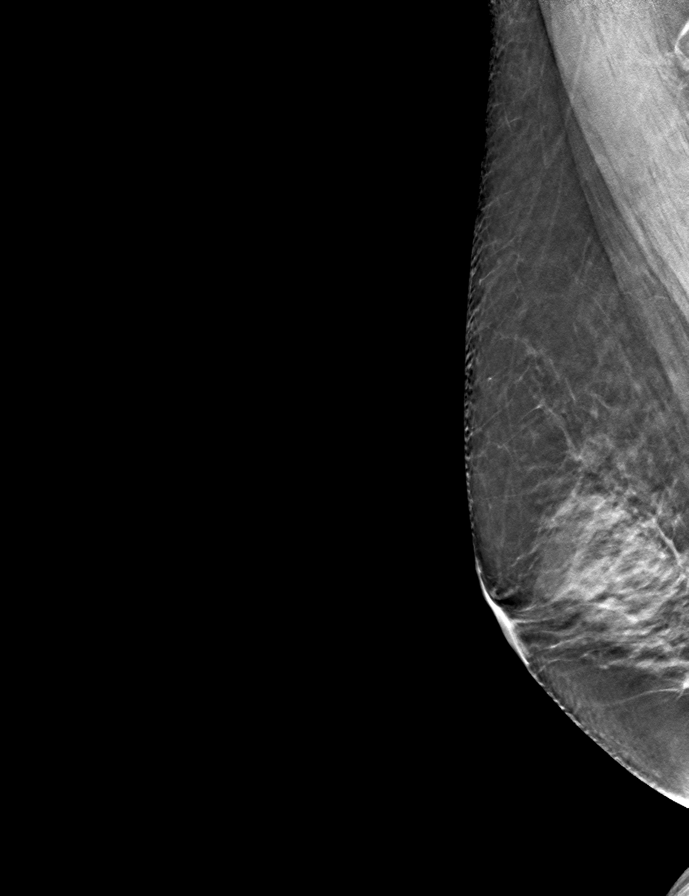

[9 of 24 positions shown; findings below may reference images not displayed]

ACR Breast Density Category c: The breast tissue is heterogeneously
dense, which may obscure small masses.
FINDINGS: There are no findings suspicious for malignancy. Images were
processed with CAD.
IMPRESSION: No mammographic evidence of malignancy. A result letter of this
screening mammogram will be mailed directly to the patient.

RECOMMENDATION:
Screening mammogram in one year. (Code:[id number])

BI-RADS CATEGORY  1: Negative.
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
## 2021-06-28 NOTE — Patient Instructions (Addendum)
Visit Information   Goals Addressed               This Visit's Progress     Patient Stated     I want to continue to managing my anxiety/stress/depression well (pt-stated)   On track     Patient Goals/Self-Care Activities: Over the next 120 days Continue with compliance of taking medication  Utilize healthy coping skills discussed Attend scheduled appointments with providers Contact PCP office with any questions or concerns        Patient verbalizes understanding of instructions provided today.   Telephone follow up appointment with care management team member scheduled for:08/16/21  Christa See, MSW, [email or web address] Phone [phone] 10:01 AM

## 2021-07-01 DIAGNOSIS — F331 Major depressive disorder, recurrent, moderate: Secondary | ICD-10-CM | POA: Diagnosis not present

## 2021-07-01 DIAGNOSIS — N1831 Chronic kidney disease, stage 3a: Secondary | ICD-10-CM

## 2021-07-05 ENCOUNTER — Telehealth: Payer: Self-pay

## 2021-07-05 NOTE — Telephone Encounter (Signed)
   Telephone encounter was:  Unsuccessful.  07/05/2021 Name: Jordan Cantu MRN: [id number] DOB: Sep 10, 1950  Unsuccessful outbound call made today to assist with:  Left message on voicemail for patient to return my call regarding assistance with Medicaid and home repair.  Outreach Attempt:  1st Attempt  A HIPAA compliant voice message was left requesting a return call.  Instructed patient to call back at [phone].  Shakora Nordquist, AAS Paralegal, [facility]  [address], [place] [address] ??[email or web address]  ?? [phone]   [email or web address]

## 2021-07-10 NOTE — Progress Notes (Signed)
BP 126/76   Pulse (!) 108   Temp (!) 97.1 F (36.2 C) (Oral)   Ht 5' 2.8" (1.595 m)   Wt 131 lb 6.4 oz (59.6 kg)   SpO2 98%   BMI 23.42 kg/m    Subjective:    Patient ID: Jordan Cantu, female    DOB: [birth year], 70 y.o.   MRN: [id number]  HPI: Jordan Cantu is a 70 y.o. female presenting on 07/11/2021 for comprehensive medical examination. Current medical complaints include:none  She currently lives with: Menopausal Symptoms: no  Patient has been meeting with Delana Meyer the social worker who has helped her with her medicaid and more income.     CHRONIC KIDNEY DISEASE CKD status: controlled Medications renally dose: no Previous renal evaluation: no Pneumovax:  Up to Date Influenza Vaccine:  Up to Date  COPD COPD status: controlled Satisfied with current treatment?: yes Oxygen use: no Dyspnea frequency:  Cough frequency:  no Rescue inhaler frequency:   Limitation of activity: no Productive cough:  Last Spirometry:  Pneumovax: Up to Date Influenza: Up to Date     ANXIETY/DEPRESSION Depression Screen done today and results listed below:  Depression screen [facility] 2/9 07/11/2021 05/31/2021 04/15/2021 12/27/2020 08/22/2020  Decreased Interest 0 0 0 0 0  Down, Depressed, Hopeless 1 0 0 0 3  PHQ - 2 Score 1 0 0 0 3  Altered sleeping 1 - 0 0 0  Tired, decreased energy 0 - 0 0 0  Change in appetite 0 - 0 0 3  Feeling bad or failure about yourself  1 - 0 0 0  Trouble concentrating 0 - 0 0 0  Moving slowly or fidgety/restless 0 - 0 0 0  Suicidal thoughts 0 - 0 0 0  PHQ-9 Score 3 - 0 0 6  Difficult doing work/chores Not difficult at all - Not difficult at all Not difficult at all Somewhat difficult  Some recent data might be hidden    The patient does not have a history of falls. I did complete a risk assessment for falls. A plan of care for falls was documented.   Past Medical History:  Past Medical History:  Diagnosis Date   Anxiety    Depression     Emphysema, unspecified ([place])    Insomnia    Tobacco use 12/28/2017   Wears contact lenses    Wears dentures    full upper    Surgical History:  Past Surgical History:  Procedure Laterality Date   COLONOSCOPY WITH PROPOFOL N/A 03/08/2018   Procedure: COLONOSCOPY WITH PROPOFOL;  Surgeon: Lucilla Lame, MD;  Location: [facility];  Service: Endoscopy;  Laterality: N/A;   NOSE SURGERY  2016   cauterization   POLYPECTOMY N/A 03/08/2018   Procedure: POLYPECTOMY;  Surgeon: Lucilla Lame, MD;  Location: [facility];  Service: Endoscopy;  Laterality: N/A;   TOOTH EXTRACTION     WISDOM TOOTH EXTRACTION      Medications:  Current Outpatient Medications on File Prior to Visit  Medication Sig   albuterol (PROVENTIL) (2.5 MG/3ML) 0.083% nebulizer solution Take 3 mLs (2.5 mg total) by nebulization every 6 (six) hours as needed for wheezing or shortness of breath.   albuterol (VENTOLIN HFA) 108 (90 Base) MCG/ACT inhaler INHALE 2 PUFFS INTO THE LUNGS EVERY 6 HOURS AS NEEDED FOR WHEEZING OR SHORTNESS OF BREATH   amitriptyline (ELAVIL) 50 MG tablet Take 2 tablets (100 mg total) by mouth at bedtime. Take 2 pills nightly for sleep.  SYMBICORT 160-4.5 MCG/ACT inhaler INHALE 2 PUFFS INTO THE LUNGS TWICE DAILY   calcium-vitamin D (OSCAL WITH D) 500-200 MG-UNIT tablet Take 2 tablets by mouth daily with breakfast. (Patient not taking: No sig reported)   No current facility-administered medications on file prior to visit.    Allergies:  Allergies  Allergen Reactions   Bupropion Nausea Only    Social History:  Social History   Socioeconomic History   Marital status: Legally Separated    Spouse name: Not on file   Number of children: Not on file   Years of education: Not on file   Highest education level: High school graduate  Occupational History   Occupation: retired  Tobacco Use   Smoking status: Some Days    Packs/day: 0.25    Years: 50.00    Pack years: 12.50    Types:  Cigarettes    Last attempt to quit: 03/11/2021    Years since quitting: 0.3    Passive exposure: Past   Smokeless tobacco: Never  Vaping Use   Vaping Use: Never used  Substance and Sexual Activity   Alcohol use: Yes    Alcohol/week: 3.0 standard drinks    Types: 3 Cans of beer per week    Comment:     Drug use: Yes    Types: Marijuana   Sexual activity: Not Currently  Other Topics Concern   Not on file  Social History Narrative   parttime caregiver for 70 year old   Social Determinants of Health   Financial Resource Strain: Medium Risk   Difficulty of Paying Living Expenses: Somewhat hard  Food Insecurity: No Food Insecurity   Worried About Charity fundraiser in the Last Year: Never true   Ran Out of Food in the Last Year: Never true  Transportation Needs: No Transportation Needs   Lack of Transportation (Medical): No   Lack of Transportation (Non-Medical): No  Physical Activity: Insufficiently Active   Days of Exercise per Week: 1 day   Minutes of Exercise per Session: 10 min  Stress: Stress Concern Present   Feeling of Stress : Very much  Social Connections: Moderately Isolated   Frequency of Communication with Friends and Family: More than three times a week   Frequency of Social Gatherings with Friends and Family: Once a week   Attends Religious Services: Never   Marine scientist or Organizations: Yes   Attends Music therapist: 1 to 4 times per year   Marital Status: Divorced  Human resources officer Violence: Not At Risk   Fear of Current or Ex-Partner: No   Emotionally Abused: No   Physically Abused: No   Sexually Abused: No   Social History   Tobacco Use  Smoking Status Some Days   Packs/day: 0.25   Years: 50.00   Pack years: 12.50   Types: Cigarettes   Last attempt to quit: 03/11/2021   Years since quitting: 0.3   Passive exposure: Past  Smokeless Tobacco Never   Social History   Substance and Sexual Activity  Alcohol Use Yes    Alcohol/week: 3.0 standard drinks   Types: 3 Cans of beer per week   Comment:      Family History:  Family History  Problem Relation Age of Onset   Pulmonary fibrosis Mother    Depression Mother    Diabetes Father    Lung disease Father    Coronary artery disease Maternal Grandfather    Diabetes Maternal Grandfather    Breast  cancer Maternal Aunt    Breast cancer Maternal Aunt    Lung cancer Maternal Uncle    Lung cancer Paternal Aunt    Breast cancer Cousin        mat cousin   Cancer Sister        lung    Past medical history, surgical history, medications, allergies, family history and social history reviewed with patient today and changes made to appropriate areas of the chart.   Review of Systems  Eyes:  Negative for blurred vision and double vision.  Respiratory:  Negative for shortness of breath.   Cardiovascular:  Negative for chest pain, palpitations and leg swelling.  Neurological:  Negative for dizziness and headaches.  Psychiatric/Behavioral:  Positive for depression. The patient is nervous/anxious.   All other ROS negative except what is listed above and in the HPI.      Objective:    BP 126/76   Pulse (!) 108   Temp (!) 97.1 F (36.2 C) (Oral)   Ht 5' 2.8" (1.595 m)   Wt 131 lb 6.4 oz (59.6 kg)   SpO2 98%   BMI 23.42 kg/m   Wt Readings from Last 3 Encounters:  07/11/21 131 lb 6.4 oz (59.6 kg)  04/15/21 132 lb 6.4 oz (60.1 kg)  12/27/20 131 lb 2 oz (59.5 kg)    Physical Exam Vitals and nursing note reviewed.  Constitutional:      General: She is awake. She is not in acute distress.    Appearance: She is well-developed. She is not ill-appearing.  HENT:     Head: Normocephalic and atraumatic.     Right Ear: Hearing, tympanic membrane, ear canal and external ear normal. No drainage.     Left Ear: Hearing, tympanic membrane, ear canal and external ear normal. No drainage.     Nose: Nose normal.     Right Sinus: No maxillary sinus tenderness or  frontal sinus tenderness.     Left Sinus: No maxillary sinus tenderness or frontal sinus tenderness.     Mouth/Throat:     Mouth: Mucous membranes are moist.     Pharynx: Oropharynx is clear. Uvula midline. No pharyngeal swelling, oropharyngeal exudate or posterior oropharyngeal erythema.  Eyes:     General: Lids are normal.        Right eye: No discharge.        Left eye: No discharge.     Extraocular Movements: Extraocular movements intact.     Conjunctiva/sclera: Conjunctivae normal.     Pupils: Pupils are equal, round, and reactive to light.     Visual Fields: Right eye visual fields normal and left eye visual fields normal.  Neck:     Thyroid: No thyromegaly.     Vascular: No carotid bruit.     Trachea: Trachea normal.  Cardiovascular:     Rate and Rhythm: Normal rate and regular rhythm.     Heart sounds: Normal heart sounds. No murmur heard.   No gallop.  Pulmonary:     Effort: Pulmonary effort is normal. No accessory muscle usage or respiratory distress.     Breath sounds: Normal breath sounds.  Chest:  Breasts:    Right: Normal.     Left: Normal.  Abdominal:     General: Bowel sounds are normal.     Palpations: Abdomen is soft. There is no hepatomegaly or splenomegaly.     Tenderness: There is no abdominal tenderness.  Musculoskeletal:        General: Normal  range of motion.     Cervical back: Normal range of motion and neck supple.     Right lower leg: No edema.     Left lower leg: No edema.  Lymphadenopathy:     Head:     Right side of head: No submental, submandibular, tonsillar, preauricular or posterior auricular adenopathy.     Left side of head: No submental, submandibular, tonsillar, preauricular or posterior auricular adenopathy.     Cervical: No cervical adenopathy.     Upper Body:     Right upper body: No supraclavicular, axillary or pectoral adenopathy.     Left upper body: No supraclavicular, axillary or pectoral adenopathy.  Skin:    General: Skin  is warm and dry.     Capillary Refill: Capillary refill takes less than 2 seconds.     Findings: No rash.  Neurological:     Mental Status: She is alert and oriented to person, place, and time.     Gait: Gait is intact.     Deep Tendon Reflexes: Reflexes are normal and symmetric.     Reflex Scores:      Brachioradialis reflexes are 2+ on the right side and 2+ on the left side.      Patellar reflexes are 2+ on the right side and 2+ on the left side. Psychiatric:        Attention and Perception: Attention normal.        Mood and Affect: Mood normal.        Speech: Speech normal.        Behavior: Behavior normal. Behavior is cooperative.        Thought Content: Thought content normal.        Judgment: Judgment normal.    Results for orders placed or performed in visit on 12/27/20  Comp Met (CMET)  Result Value Ref Range   Glucose 83 65 - 99 mg/dL   BUN 10 8 - 27 mg/dL   Creatinine, Ser 1.17 (H) 0.57 - 1.00 mg/dL   eGFR 51 (L) >59 mL/min/1.73   BUN/Creatinine Ratio 9 (L) 12 - 28   Sodium 142 134 - 144 mmol/L   Potassium 4.2 3.5 - 5.2 mmol/L   Chloride 102 96 - 106 mmol/L   CO2 24 20 - 29 mmol/L   Calcium 9.1 8.7 - 10.3 mg/dL   Total Protein 6.7 6.0 - 8.5 g/dL   Albumin 4.3 3.8 - 4.8 g/dL   Globulin, Total 2.4 1.5 - 4.5 g/dL   Albumin/Globulin Ratio 1.8 1.2 - 2.2   Bilirubin Total <0.2 0.0 - 1.2 mg/dL   Alkaline Phosphatase 113 44 - 121 IU/L   AST 15 0 - 40 IU/L   ALT 13 0 - 32 IU/L  Lipid Profile  Result Value Ref Range   Cholesterol, Total 233 (H) 100 - 199 mg/dL   Triglycerides 235 (H) 0 - 149 mg/dL   HDL 56 >39 mg/dL   VLDL Cholesterol Cal 42 (H) 5 - 40 mg/dL   LDL Chol Calc (NIH) 135 (H) 0 - 99 mg/dL   Chol/HDL Ratio 4.2 0.0 - 4.4 ratio  [id number] 11+Oxyco+Alc+Crt-Bund  Result Value Ref Range   Ethanol Negative Cutoff=0.020 %   Amphetamines, Urine Negative Cutoff=1000 ng/mL   Barbiturate Negative Cutoff=200 ng/mL   BENZODIAZ UR QL See Final Results Cutoff=200 ng/mL    Cannabinoid Quant, Ur See Final Results Cutoff=50 ng/mL   Cocaine (Metabolite) Negative Cutoff=300 ng/mL   OPIATE SCREEN URINE Negative Cutoff=300 ng/mL  Oxycodone/Oxymorphone, Urine Negative Cutoff=300 ng/mL   Phencyclidine Negative Cutoff=25 ng/mL   Methadone Screen, Urine Negative Cutoff=300 ng/mL   Propoxyphene Negative Cutoff=300 ng/mL   Meperidine Negative Cutoff=200 ng/mL   Tramadol Negative Cutoff=200 ng/mL   Creatinine 104.4 20.0 - 300.0 mg/dL   pH, Urine 5.3 4.5 - 8.9  Benzodiazepines Confirm, Urine  Result Value Ref Range   Benzodiazepines Positive (A) Cutoff=100 ng/mL   Nordiazepam Negative Cutoff=100   Oxazepam Negative Cutoff=100   Flurazepam Negative Cutoff=100   Lorazepam Negative Cutoff=100   Alprazolam Positive (A)    Alprazolam Conf. 409 Cutoff=100 ng/mL   Clonazepam Negative Cutoff=100   Temazepam Negative Cutoff=100   Triazolam Negative Cutoff=100   Midazolam Negative Cutoff=100  Cannabinoid Conf, Ur  Result Value Ref Range   CANNABINOIDS Positive (A) Cutoff=50   Carboxy THC GC/MS Conf 78 Cutoff=15 ng/mL      Assessment & Plan:   Problem List Items Addressed This Visit       Cardiovascular and Mediastinum   Aortic atherosclerosis (HCC)    Chronic.  Controlled.  Continue with Crestor 21m.  Will increase at next visit.  Labs ordered today.  Cardiac risk score discussed with patient at visit today. Recommend starting a Statin due to risk score of 14.6%.  Noted on Imaging June 2019.      Relevant Medications   rosuvastatin (CRESTOR) 5 MG tablet     Respiratory   Emphysema, unspecified (HCC)    Uncontrolled.  Patient started smoking again.  She is using Symbicort to help with symptoms.  Would like to use Chantix to quit smoking.  She will call the office once her Medicaid is approved to have me fill the chantix.    Discussed how to properly use medication.         Genitourinary   Chronic kidney disease, stage 3a (HCC)    Chronic.  Stable.   Continue with current medication regimen.  Will adjust medications as needed. Labs ordered today.         Other   Anxiety    Under good control on current regimen. Continue current regimen. Continue to monitor. Call with any concerns. Refills given for 3 months. Follow up 3 months. PDMP checked.  Up to date on Controlled substance agreement and UDS.  Patient aware of risks of long term benzodiazepine use and okay with continuing medication.        Relevant Medications   ALPRAZolam (XANAX) 0.5 MG tablet   Depression    Chronic.  Improved.  Continue with Amitriptyline for Depression.  Follow up in 3 months.       Relevant Medications   ALPRAZolam (XANAX) 0.5 MG tablet   Other Visit Diagnoses     Annual physical exam    -  Primary   Health maintenance reviewed during visit today. Labs ordered. Flu shot and pneumonia shot given. Patient does not want to get another Colonoscopy.   Relevant Orders   MM Digital Screening   Pneumococcal polysaccharide vaccine 23-valent greater than or equal to 2yo subcutaneous/IM   CBC with Differential/Platelet   Comprehensive metabolic panel   Lipid panel   TSH   Urinalysis, Routine w reflex microscopic   Flu Vaccine QUAD High Dose(Fluad)   Encounter for smoking cessation counseling       Patient counseled on smoking cessation.  Discussed proper use of Chantix.  Will send for patient once her medicaid is approved.     Encounter for screening mammogram for malignant neoplasm of breast  Relevant Orders   MM Digital Screening   Screening for colon cancer       Need for pneumococcal vaccine       Relevant Orders   Pneumococcal polysaccharide vaccine 23-valent greater than or equal to 2yo subcutaneous/IM   Need for influenza vaccination       Relevant Orders   Flu Vaccine QUAD High Dose(Fluad)        Follow up plan: Return in about 3 months (around 10/11/2021) for HTN, HLD, DM2 FU.   LABORATORY TESTING:  - Pap smear: not  applicable  IMMUNIZATIONS:   - Tdap: Tetanus vaccination status reviewed: last tetanus booster within 10 years. - Influenza: Administered today - Pneumovax: Administered today - Prevnar: Up to date - COVID: Up to date - HPV: Not applicable - Shingrix vaccine:  Discussed at visit today.   SCREENING: -Mammogram: Ordered today  - Colonoscopy: Ordered today  - Bone Density: Not applicable  -Hearing Test: Not applicable  -Spirometry: Not applicable   PATIENT COUNSELING:   Advised to take 1 mg of folate supplement per day if capable of pregnancy.   Sexuality: Discussed sexually transmitted diseases, partner selection, use of condoms, avoidance of unintended pregnancy  and contraceptive alternatives.   Advised to avoid cigarette smoking.  I discussed with the patient that most people either abstain from alcohol or drink within safe limits (<=14/week and <=4 drinks/occasion for males, <=7/weeks and <= 3 drinks/occasion for females) and that the risk for alcohol disorders and other health effects rises proportionally with the number of drinks per week and how often a drinker exceeds daily limits.  Discussed cessation/primary prevention of drug use and availability of treatment for abuse.   Diet: Encouraged to adjust caloric intake to maintain  or achieve ideal body weight, to reduce intake of dietary saturated fat and total fat, to limit sodium intake by avoiding high sodium foods and not adding table salt, and to maintain adequate dietary potassium and calcium preferably from fresh fruits, vegetables, and low-fat dairy products.    stressed the importance of regular exercise  Injury prevention: Discussed safety belts, safety helmets, smoke detector, smoking near bedding or upholstery.   Dental health: Discussed importance of regular tooth brushing, flossing, and dental visits.    NEXT PREVENTATIVE PHYSICAL DUE IN 1 YEAR. Return in about 3 months (around 10/11/2021) for HTN, HLD, DM2  FU.

## 2021-07-11 ENCOUNTER — Other Ambulatory Visit: Payer: Self-pay

## 2021-07-11 ENCOUNTER — Ambulatory Visit (INDEPENDENT_AMBULATORY_CARE_PROVIDER_SITE_OTHER): Payer: Medicare Other | Admitting: Nurse Practitioner

## 2021-07-11 ENCOUNTER — Encounter: Payer: Self-pay | Admitting: Nurse Practitioner

## 2021-07-11 VITALS — BP 126/76 | HR 108 | Temp 97.1°F | Ht 62.8 in | Wt 131.4 lb

## 2021-07-11 DIAGNOSIS — Z136 Encounter for screening for cardiovascular disorders: Secondary | ICD-10-CM | POA: Diagnosis not present

## 2021-07-11 DIAGNOSIS — Z0000 Encounter for general adult medical examination without abnormal findings: Secondary | ICD-10-CM | POA: Diagnosis not present

## 2021-07-11 DIAGNOSIS — I700 Atherosclerosis of aorta: Secondary | ICD-10-CM

## 2021-07-11 DIAGNOSIS — N1831 Chronic kidney disease, stage 3a: Secondary | ICD-10-CM | POA: Diagnosis not present

## 2021-07-11 DIAGNOSIS — Z23 Encounter for immunization: Secondary | ICD-10-CM | POA: Diagnosis not present

## 2021-07-11 DIAGNOSIS — J439 Emphysema, unspecified: Secondary | ICD-10-CM | POA: Diagnosis not present

## 2021-07-11 DIAGNOSIS — F419 Anxiety disorder, unspecified: Secondary | ICD-10-CM

## 2021-07-11 DIAGNOSIS — F331 Major depressive disorder, recurrent, moderate: Secondary | ICD-10-CM

## 2021-07-11 DIAGNOSIS — Z1211 Encounter for screening for malignant neoplasm of colon: Secondary | ICD-10-CM | POA: Diagnosis not present

## 2021-07-11 DIAGNOSIS — Z716 Tobacco abuse counseling: Secondary | ICD-10-CM

## 2021-07-11 DIAGNOSIS — Z1231 Encounter for screening mammogram for malignant neoplasm of breast: Secondary | ICD-10-CM | POA: Diagnosis not present

## 2021-07-11 LAB — URINALYSIS, ROUTINE W REFLEX MICROSCOPIC
Bilirubin, UA: NEGATIVE
Glucose, UA: NEGATIVE
Ketones, UA: NEGATIVE
Nitrite, UA: NEGATIVE
Protein,UA: NEGATIVE
Specific Gravity, UA: 1.015 (ref 1.005–1.030)
Urobilinogen, Ur: 0.2 mg/dL (ref 0.2–1.0)
pH, UA: 5 (ref 5.0–7.5)

## 2021-07-11 LAB — MICROSCOPIC EXAMINATION

## 2021-07-11 MED ORDER — ALPRAZOLAM 0.5 MG PO TABS: ORAL_TABLET | ORAL | 2 refills | Status: DC

## 2021-07-11 MED ORDER — ROSUVASTATIN CALCIUM 5 MG PO TABS: 5.0000 mg | ORAL_TABLET | Freq: Every day | ORAL | 1 refills | Status: DC

## 2021-07-11 NOTE — Assessment & Plan Note (Signed)
Chronic.  Controlled.  Continue with Crestor 5mg .  Will increase at next visit.  Labs ordered today.  Cardiac risk score discussed with patient at visit today. Recommend starting a Statin due to risk score of 14.6%.  Noted on Imaging June 2019.

## 2021-07-11 NOTE — Assessment & Plan Note (Signed)
Uncontrolled.  Patient started smoking again.  She is using Symbicort to help with symptoms.  Would like to use Chantix to quit smoking.  She will call the office once her Medicaid is approved to have me fill the chantix.    Discussed how to properly use medication.

## 2021-07-11 NOTE — Assessment & Plan Note (Signed)
Under good control on current regimen. Continue current regimen. Continue to monitor. Call with any concerns. Refills given for 3 months. Follow up 3 months. PDMP checked.  Up to date on Controlled substance agreement and UDS.  Patient aware of risks of long term benzodiazepine use and okay with continuing medication.

## 2021-07-11 NOTE — Assessment & Plan Note (Signed)
Chronic.  Improved.  Continue with Amitriptyline for Depression.  Follow up in 3 months.

## 2021-07-11 NOTE — Assessment & Plan Note (Signed)
Chronic.  Stable.  Continue with current medication regimen.  Will adjust medications as needed. Labs ordered today.

## 2021-07-12 LAB — LIPID PANEL
Chol/HDL Ratio: 3.1 ratio (ref 0.0–4.4)
Cholesterol, Total: 178 mg/dL (ref 100–199)
HDL: 57 mg/dL (ref >39)
LDL Chol Calc (NIH): 104 mg/dL — ABNORMAL HIGH (ref 0–99)
Triglycerides: 91 mg/dL (ref 0–149)
VLDL Cholesterol Cal: 17 mg/dL (ref 5–40)

## 2021-07-12 LAB — COMPREHENSIVE METABOLIC PANEL
ALT: 12 IU/L (ref 0–32)
AST: 15 IU/L (ref 0–40)
Albumin/Globulin Ratio: 1.6 (ref 1.2–2.2)
Albumin: 4.1 g/dL (ref 3.8–4.8)
Alkaline Phosphatase: 143 IU/L — ABNORMAL HIGH (ref 44–121)
BUN/Creatinine Ratio: 10 — ABNORMAL LOW (ref 12–28)
BUN: 12 mg/dL (ref 8–27)
Bilirubin Total: 0.2 mg/dL (ref 0.0–1.2)
CO2: 22 mmol/L (ref 20–29)
Calcium: 9.7 mg/dL (ref 8.7–10.3)
Chloride: 102 mmol/L (ref 96–106)
Creatinine, Ser: 1.21 mg/dL — ABNORMAL HIGH (ref 0.57–1.00)
Globulin, Total: 2.5 g/dL (ref 1.5–4.5)
Glucose: 97 mg/dL (ref 70–99)
Potassium: 4.6 mmol/L (ref 3.5–5.2)
Sodium: 138 mmol/L (ref 134–144)
Total Protein: 6.6 g/dL (ref 6.0–8.5)
eGFR: 49 mL/min/{1.73_m2} — ABNORMAL LOW (ref >59)

## 2021-07-12 LAB — CBC WITH DIFFERENTIAL/PLATELET
Basophils Absolute: 0.1 10*3/uL (ref 0.0–0.2)
Basos: 1 %
EOS (ABSOLUTE): 0.1 10*3/uL (ref 0.0–0.4)
Eos: 2 %
Hematocrit: 38.2 % (ref 34.0–46.6)
Hemoglobin: 12.8 g/dL (ref 11.1–15.9)
Immature Grans (Abs): 0 10*3/uL (ref 0.0–0.1)
Immature Granulocytes: 0 %
Lymphocytes Absolute: 1.8 10*3/uL (ref 0.7–3.1)
Lymphs: 19 %
MCH: 31.9 pg (ref 26.6–33.0)
MCHC: 33.5 g/dL (ref 31.5–35.7)
MCV: 95 fL (ref 79–97)
Monocytes Absolute: 0.8 10*3/uL (ref 0.1–0.9)
Monocytes: 8 %
Neutrophils Absolute: 6.7 10*3/uL (ref 1.4–7.0)
Neutrophils: 70 %
Platelets: 328 10*3/uL (ref 150–450)
RBC: 4.01 x10E6/uL (ref 3.77–5.28)
RDW: 13 % (ref 11.7–15.4)
WBC: 9.6 10*3/uL (ref 3.4–10.8)

## 2021-07-12 LAB — TSH: TSH: 5.67 u[IU]/mL — ABNORMAL HIGH (ref 0.450–4.500)

## 2021-07-12 NOTE — Progress Notes (Signed)
Please let patient know that her lab work shows that her complete blood count is normal.  Her kidney function shows some chronic kidney disease but is consistent with her prior labs.  She should avoid NSAIDS (motrin, ibuprofen, and aleve)  and drink plenty of water to help with this.  Cholesterol is slightly elevated but much better than 6 months ago. Keep up the good work. He thyroid function is abnormal.  We will recheck this at her follow up with me.  Her urinalysis was abnormal.  I have sent it to out to see if she needs to be treated with any antibiotics.  We will let her know once those results return.

## 2021-07-15 ENCOUNTER — Telehealth: Payer: Self-pay

## 2021-07-15 MED ORDER — CHANTIX STARTING MONTH PAK 0.5 MG X 11 & 1 MG X 42 PO TBPK: 0.5000 mg | ORAL_TABLET | Freq: Every day | ORAL | 0 refills | Status: DC

## 2021-07-15 NOTE — Telephone Encounter (Signed)
Chantix was sent to [facility].

## 2021-07-15 NOTE — Telephone Encounter (Signed)
Called patient to give lab results. Patient states she discussed Chantix with you at her last OV. Would like that called into her pharmacy.

## 2021-07-31 ENCOUNTER — Telehealth: Payer: Self-pay

## 2021-07-31 NOTE — Telephone Encounter (Signed)
   Telephone encounter was:  Unsuccessful.  07/31/2021 Name: Jordan Cantu MRN: [id number] DOB: 08/14/1951  Unsuccessful outbound call made today to assist with:   Medicaid and home repair.  Outreach Attempt:  2nd Attempt  A HIPAA compliant voice message was left requesting a return call.  Instructed patient to call back at [phone].  Yuliet Needs, AAS Paralegal, [employer]  [address], [place] [address] ??[email or web address]  ?? [phone]   [email or web address]

## 2021-08-01 ENCOUNTER — Telehealth: Payer: Self-pay

## 2021-08-01 NOTE — Telephone Encounter (Signed)
   Telephone encounter was:  Successful.  08/01/2021 Name: Jordan Cantu MRN: [id number] DOB: 1951/05/05  Jordan Cantu is a 70 y.o. year old female who is a primary care patient of Jon Billings, NP . The community resource team was consulted for assistance with  Medicaid and home repair  Care guide performed the following interventions: Patient provided with information about care guide support team and interviewed to confirm resource needs Placed referral to Independent Living/Vocational Rehab via [id number] .  Follow Up Plan:  Care guide will follow up with patient by phone over the next 7-10 days.  SDOH:  (Social Determinants of Health) assessments and interventions performed: Financial:Home Safety/home repair  SDOH Interventions: [id number] referral placed   Harshika Mago, AAS Paralegal, [facility]  [address], [place] [address] ??[email or web address]  ?? [phone]   [email or web address]

## 2021-08-02 ENCOUNTER — Telehealth: Payer: Self-pay

## 2021-08-02 NOTE — Telephone Encounter (Signed)
   Telephone encounter was:  Successful.  08/02/2021 Name: Jordan Cantu MRN: [id number] DOB: [birth year]  Jordan Cantu is a 70 y.o. year old female who is a primary care patient of Jon Billings, NP . The community resource team was consulted for assistance with  home repairs  Care guide performed the following interventions: Spoke with patient to let her know her referral to [facility] has been rejected.  Patient stated her brother will lend her the money to pay her insurance deductible to have her roof repaired.   Follow Up Plan:  No further follow up planned at this time. The patient has been provided with needed resources.  Vicent Febles, AAS Paralegal, [facility]  [address], [place] [address] ??[email or web address]  ?? [phone]   [email or web address]

## 2021-08-16 ENCOUNTER — Telehealth: Payer: Medicare Other

## 2021-08-16 ENCOUNTER — Telehealth: Payer: Self-pay | Admitting: Licensed Clinical Social Worker

## 2021-08-16 NOTE — Telephone Encounter (Signed)
° °   Clinical Social Work  Care Management   Phone Outreach    08/16/2021 Name: Jordan Cantu MRN: [id number] DOB: February 22, 1951  Jordan Cantu is a 70 y.o. year old female who is a primary care patient of Jon Billings, NP .   Reason for referral: Mental Health Counseling and Resources.    F/U phone call today to assess needs, progress and barriers with care plan goals.   Telephone outreach was unsuccessful. A HIPPA compliant phone message was left for the patient providing contact information and requesting a return call.   Plan:Will route chart to Care Guide to see if patient would like to reschedule phone appointment   Review of patient status, including review of consultants reports, relevant laboratory and other test results, and collaboration with appropriate care team members and the patient's provider was performed as part of comprehensive patient evaluation and provision of care management services.    Christa See, MSW, [email or web address] Phone [phone] 11:22 AM

## 2021-08-20 NOTE — Telephone Encounter (Signed)
Pt has been rescheduled. 

## 2021-08-26 ENCOUNTER — Other Ambulatory Visit: Payer: Self-pay | Admitting: Nurse Practitioner

## 2021-08-27 ENCOUNTER — Other Ambulatory Visit: Payer: Self-pay | Admitting: Nurse Practitioner

## 2021-08-28 ENCOUNTER — Other Ambulatory Visit: Payer: Self-pay | Admitting: Nurse Practitioner

## 2021-08-28 MED ORDER — VARENICLINE TARTRATE 1 MG PO TABS: 1.0000 mg | ORAL_TABLET | Freq: Two times a day (BID) | ORAL | 1 refills | Status: AC

## 2021-08-28 NOTE — Telephone Encounter (Signed)
Last ordered today. Pharmacy verified receipt. This is a duplicate.

## 2021-08-28 NOTE — Telephone Encounter (Signed)
Pt called to report that this has worked well for her, says she runs out on the 31st.

## 2021-08-28 NOTE — Telephone Encounter (Signed)
Requested medication (s) are due for refill today: yes  Requested medication (s) are on the active medication list: yes  Last refill: 07/15/21  Future visit scheduled: yes  Notes to clinic: Patient is requesting refill on starter back of chantix.        Requested Prescriptions  Pending Prescriptions Disp Refills   varenicline (CHANTIX PAK) 0.5 MG X 11 & 1 MG X 42 tablet [Pharmacy Med Name: VARENICLINE STARTING MONTH PAK 81'V] 88 each     Sig: TAKE 0.5 MG TABLET BY MOUTH ONCE DAILY FOR 3 DAYS,THEN INCREASE TO ONE 0.5 MG TABLET TWICE DAILY FOR 4 DAYS, THEN INCREASE TO 1 MG TABLET TWICE     Psychiatry:  Drug Dependence Therapy Passed - 08/27/2021  2:48 PM      Passed - Valid encounter within last 12 months    Recent Outpatient Visits           1 month ago Annual physical exam   [place], NP   4 months ago Encounter for screening breast examination   [place], [place], DO   8 months ago Aortic atherosclerosis ([place])   [facility] Jon Billings, NP   1 year ago Elevated TSH   [place] Myles Gip, DO   1 year ago Annual physical exam   [facility] Eulogio Bear, NP       Future Appointments             In 1 month Jon Billings, NP [facility], [place]

## 2021-08-28 NOTE — Telephone Encounter (Signed)
Can we send in the continuing pack instead of the starting pack please?

## 2021-08-28 NOTE — Telephone Encounter (Signed)
Requested medication (s) are due for refill today: Yes  Requested medication (s) are on the active medication list: Yes  Last refill:  07/15/21  Future visit scheduled: Yes  Notes to clinic:  Unsure if provider wants to send in another starter pack      Requested Prescriptions  Pending Prescriptions Disp Refills   varenicline (CHANTIX PAK) 0.5 MG X 11 & 1 MG X 42 tablet [Pharmacy Med Name: VARENICLINE STARTING MONTH PAK 53'S] 18 each     Sig: TAKE 0.5 MG TABLET BY MOUTH ONCE DAILY FOR 3 DAYS, THEN INCREASE TO ONE 0.5 MG TABLET TWICE DAILY FOR 4 DAYS, THEN INCREASE TO 1 MG TABLET TWICE DAILY     Psychiatry:  Drug Dependence Therapy Passed - 08/26/2021 12:54 PM      Passed - Valid encounter within last 12 months    Recent Outpatient Visits           1 month ago Annual physical exam   [place], NP   4 months ago Encounter for screening breast examination   [place], Megan P, DO   8 months ago Aortic atherosclerosis ([place])   [facility] Jon Billings, NP   1 year ago Elevated TSH   [place] Myles Gip, DO   1 year ago Annual physical exam   [facility] Eulogio Bear, NP       Future Appointments             In 1 month Jon Billings, NP [facility], [place]

## 2021-08-30 ENCOUNTER — Ambulatory Visit (INDEPENDENT_AMBULATORY_CARE_PROVIDER_SITE_OTHER): Payer: Medicare Other | Admitting: Licensed Clinical Social Worker

## 2021-08-30 DIAGNOSIS — F331 Major depressive disorder, recurrent, moderate: Secondary | ICD-10-CM

## 2021-08-30 DIAGNOSIS — G4700 Insomnia, unspecified: Secondary | ICD-10-CM

## 2021-08-30 DIAGNOSIS — N1831 Chronic kidney disease, stage 3a: Secondary | ICD-10-CM

## 2021-08-30 DIAGNOSIS — F419 Anxiety disorder, unspecified: Secondary | ICD-10-CM

## 2021-08-31 DIAGNOSIS — F331 Major depressive disorder, recurrent, moderate: Secondary | ICD-10-CM
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
## 2021-09-03 NOTE — Patient Instructions (Signed)
Visit Information  Thank you for taking time to visit with me today. Please don't hesitate to contact me if I can be of assistance to you before our next scheduled telephone appointment.  Following are the goals we discussed today:  Patient Goals/Self-Care Activities: Over the next 120 days Continue with compliance of taking medication  Utilize healthy coping skills discussed Attend scheduled appointments with providers Contact PCP office with any questions or concerns  Our next appointment is by telephone on 12/16/21 at 2:00 PM  Please call the care guide team at [phone] if you need to cancel or reschedule your appointment.   If you are experiencing a Mental Health or [place] or need someone to talk to, please call the Suicide and Crisis Lifeline: 988 call 911   Patient verbalizes understanding of instructions provided today  Christa See, MSW, [email or web address] Phone [phone] 10:54 AM

## 2021-09-25 ENCOUNTER — Ambulatory Visit
Admission: EM | Admit: 2021-09-25 | Discharge: 2021-09-25 | Disposition: A | Discharge status: Home / Self Care | Payer: Medicare Other | Attending: Emergency Medicine | Admitting: Emergency Medicine

## 2021-09-25 ENCOUNTER — Other Ambulatory Visit: Payer: Self-pay

## 2021-09-25 DIAGNOSIS — R197 Diarrhea, unspecified: Secondary | ICD-10-CM | POA: Diagnosis not present

## 2021-09-25 DIAGNOSIS — Z20822 Contact with and (suspected) exposure to covid-19: Secondary | ICD-10-CM

## 2021-09-25 MED ORDER — MOLNUPIRAVIR EUA 200MG CAPSULE: 4.0000 | ORAL_CAPSULE | Freq: Two times a day (BID) | ORAL | 0 refills | Status: AC

## 2021-09-25 NOTE — ED Provider Notes (Signed)
[facility]    CSN: [id number] Arrival date & time: 09/25/21  1300      History   Chief Complaint Chief Complaint  Patient presents with   Covid Exposure    HPI Jordan Cantu is a 71 y.o. female.   Patient presents with diarrhea for 3 days, last episode yesterday.  Endorses mild cough, rhinorrhea, and subjective fever beginning today.  Recently returned from travel from [place], all members she traveled with positive for [place].  Decreased appetite but tolerating fluids.  History of COPD, former smoker, CKD.  Has not attempted treatment of symptoms.  Past Medical History:  Diagnosis Date   Anxiety    Depression    Emphysema, unspecified ([place])    Insomnia    Tobacco use 12/28/2017   Wears contact lenses    Wears dentures    full upper    Patient Active Problem List   Diagnosis Date Noted   Aortic atherosclerosis ([place]) 11/21/2020   Chronic kidney disease, stage 3a ([place]) 05/22/2020   Age-related osteoporosis without current pathological fracture 05/21/2020   Former cigarette smoker 01/20/2019   Controlled substance agreement signed 04/16/2018   Spleen laceration 03/11/2018   Benign neoplasm of cecum    Benign neoplasm of transverse colon    Rectal polyp    Polyp of sigmoid colon    Emphysema, unspecified ([place]) 12/28/2017   Anxiety 12/28/2017   Depression 12/28/2017   Insomnia 12/28/2017    Past Surgical History:  Procedure Laterality Date   COLONOSCOPY WITH PROPOFOL N/A 03/08/2018   Procedure: COLONOSCOPY WITH PROPOFOL;  Surgeon: Lucilla Lame, MD;  Location: [facility];  Service: Endoscopy;  Laterality: N/A;   NOSE SURGERY  2016   cauterization   POLYPECTOMY N/A 03/08/2018   Procedure: POLYPECTOMY;  Surgeon: Lucilla Lame, MD;  Location: [facility];  Service: Endoscopy;  Laterality: N/A;   TOOTH EXTRACTION     WISDOM TOOTH EXTRACTION      OB History   No obstetric history on file.      Home Medications    Prior to  Admission medications   Medication Sig Start Date End Date Taking? Authorizing Provider  albuterol (PROVENTIL) (2.5 MG/3ML) 0.083% nebulizer solution Take 3 mLs (2.5 mg total) by nebulization every 6 (six) hours as needed for wheezing or shortness of breath. 12/27/20  Yes Jon Billings, NP  albuterol (VENTOLIN HFA) 108 (90 Base) MCG/ACT inhaler INHALE 2 PUFFS INTO THE LUNGS EVERY 6 HOURS AS NEEDED FOR WHEEZING OR SHORTNESS OF BREATH 12/22/20  Yes Jon Billings, NP  ALPRAZolam Duanne Moron) 0.5 MG tablet TAKE 1 TABLET(0.5 MG) BY MOUTH THREE TIMES DAILY AS NEEDED FOR ANXIETY 07/11/21  Yes Jon Billings, NP  amitriptyline (ELAVIL) 50 MG tablet Take 2 tablets (100 mg total) by mouth at bedtime. Take 2 pills nightly for sleep. 04/15/21  Yes [place], Megan P, DO  calcium-vitamin D (OSCAL WITH D) 500-200 MG-UNIT tablet Take 2 tablets by mouth daily with breakfast. 12/27/20  Yes Jon Billings, NP  rosuvastatin (CRESTOR) 5 MG tablet Take 1 tablet (5 mg total) by mouth daily. 07/11/21  Yes Jon Billings, NP  SYMBICORT 160-4.5 MCG/ACT inhaler INHALE 2 PUFFS INTO THE LUNGS TWICE DAILY 05/08/21  Yes Jon Billings, NP  varenicline (CHANTIX) 1 MG tablet Take 1 tablet (1 mg total) by mouth 2 (two) times daily. 08/28/21  Yes Jon Billings, NP    Family History Family History  Problem Relation Age of Onset   Pulmonary fibrosis Mother  Depression Mother    Diabetes Father    Lung disease Father    Coronary artery disease Maternal Grandfather    Diabetes Maternal Grandfather    Breast cancer Maternal Aunt    Breast cancer Maternal Aunt    Lung cancer Maternal Uncle    Lung cancer Paternal Aunt    Breast cancer Cousin        mat cousin   Cancer Sister        lung    Social History Social History   Tobacco Use   Smoking status: Some Days    Packs/day: 0.25    Years: 50.00    Pack years: 12.50    Types: Cigarettes    Last attempt to quit: 03/11/2021    Years since quitting: 0.5     Passive exposure: Past   Smokeless tobacco: Never  Vaping Use   Vaping Use: Never used  Substance Use Topics   Alcohol use: Yes    Alcohol/week: 3.0 standard drinks    Types: 3 Cans of beer per week    Comment:     Drug use: Yes    Types: Marijuana     Allergies   Bupropion   Review of Systems Review of Systems  Constitutional:  Positive for appetite change. Negative for activity change, chills, diaphoresis, fatigue, fever and unexpected weight change.  HENT:  Positive for rhinorrhea. Negative for congestion, dental problem, drooling, ear discharge, ear pain, facial swelling, hearing loss, mouth sores, nosebleeds, postnasal drip, sinus pressure, sinus pain, sneezing, sore throat, tinnitus, trouble swallowing and voice change.   Respiratory:  Positive for cough. Negative for apnea, choking, chest tightness, shortness of breath, wheezing and stridor.   Cardiovascular: Negative.   Gastrointestinal:  Positive for diarrhea. Negative for abdominal distention, abdominal pain, anal bleeding, blood in stool, constipation, nausea, rectal pain and vomiting.  Skin: Negative.   Neurological: Negative.     Physical Exam Triage Vital Signs ED Triage Vitals  Enc Vitals Group     BP 09/25/21 1328 (!) 165/97     Pulse Rate 09/25/21 1328 (!) 108     Resp 09/25/21 1328 18     Temp 09/25/21 1328 97.7 F (36.5 C)     Temp Source 09/25/21 1328 Oral     SpO2 09/25/21 1328 100 %     Weight 09/25/21 1325 125 lb (56.7 kg)     Height 09/25/21 1325 5\' 3"  (1.6 m)     Head Circumference --      Peak Flow --      Pain Score 09/25/21 1325 0     Pain Loc --      Pain Edu? --      Excl. in [place]? --    No data found.  Updated Vital Signs BP (!) 165/97 (BP Location: Left Arm)    Pulse (!) 108    Temp 97.7 F (36.5 C) (Oral)    Resp 18    Ht 5\' 3"  (1.6 m)    Wt 125 lb (56.7 kg)    SpO2 100%    BMI 22.14 kg/m   Visual Acuity Right Eye Distance:   Left Eye Distance:   Bilateral Distance:    Right  Eye Near:   Left Eye Near:    Bilateral Near:     Physical Exam Constitutional:      Appearance: Normal appearance.  HENT:     Head: Normocephalic.     Right Ear: Tympanic membrane, ear canal and external  ear normal.     Left Ear: Tympanic membrane, ear canal and external ear normal.     Nose: Congestion present. No rhinorrhea.     Mouth/Throat:     Mouth: Mucous membranes are moist.     Pharynx: Posterior oropharyngeal erythema present.  Eyes:     Extraocular Movements: Extraocular movements intact.  Cardiovascular:     Rate and Rhythm: Normal rate and regular rhythm.     Pulses: Normal pulses.     Heart sounds: Normal heart sounds.  Pulmonary:     Effort: Pulmonary effort is normal.     Breath sounds: Normal breath sounds.  Musculoskeletal:     Cervical back: Normal range of motion and neck supple.  Skin:    General: Skin is warm and dry.  Neurological:     Mental Status: She is alert and oriented to person, place, and time. Mental status is at baseline.  Psychiatric:        Mood and Affect: Mood normal.        Behavior: Behavior normal.     UC Treatments / Results  Labs (all labs ordered are listed, but only abnormal results are displayed) Labs Reviewed - No data to display  EKG   Radiology No results found.  Procedures Procedures (including critical care time)  Medications Ordered in UC Medications - No data to display  Initial Impression / Assessment and Plan / UC Course  I have reviewed the triage vital signs and the nursing notes.  Pertinent labs & imaging results that were available during my care of the patient were reviewed by me and considered in my medical decision making (see chart for details).  .  Diarrhea Exposure to COVID-19  Vital signs stable, patient in no signs of distress, stable for outpatient treatment, etiology of symptoms most likely COVID-19 due to close exposure, will defer testing, will move forward with treatment, antiviral  prescribed, discussed administration, patient to use over-the-counter medications for remaining symptom management, urgent care follow-up as needed Final Clinical Impressions(s) / UC Diagnoses   Final diagnoses:  None   Discharge Instructions   None    ED Prescriptions   None    PDMP not reviewed this encounter.   Hans Eden, NP 09/25/21 1357

## 2021-09-25 NOTE — ED Triage Notes (Signed)
Pt here with C/O diarrhea since Monday, states whole family has Covid. Went to [employer] with family and everyone came home with it.

## 2021-09-25 NOTE — Discharge Instructions (Signed)
You are being treated prophylactically to [place] because all of your family members are positive, please continue to quarantine until Saturday, September 28, 2021  Take the antiviral twice a day for the next 5 days with food  You may attempt all of the following below in addition    You can take Tylenol and/or Ibuprofen as needed for fever reduction and pain relief.   For cough: honey 1/2 to 1 teaspoon (you can dilute the honey in water or another fluid).  You can also use guaifenesin and dextromethorphan for cough. You can use a humidifier for chest congestion and cough.  If you don't have a humidifier, you can sit in the bathroom with the hot shower running.      For sore throat: try warm salt water gargles, cepacol lozenges, throat spray, warm tea or water with lemon/honey, popsicles or ice, or OTC cold relief medicine for throat discomfort.   For congestion: take a daily anti-histamine like Zyrtec, Claritin, and a oral decongestant, such as pseudoephedrine.  You can also use Flonase 1-2 sprays in each nostril daily.   It is important to stay hydrated: drink plenty of fluids (water, gatorade/powerade/pedialyte, juices, or teas) to keep your throat moisturized and help further relieve irritation/discomfort.

## 2021-09-28 ENCOUNTER — Other Ambulatory Visit: Payer: Self-pay | Admitting: Family Medicine

## 2021-09-28 NOTE — Telephone Encounter (Signed)
Requested Prescriptions  Pending Prescriptions Disp Refills   amitriptyline (ELAVIL) 50 MG tablet [Pharmacy Med Name: AMITRIPTYLINE 50MG  TABLETS] 180 tablet 1    Sig: TAKE 2 TABLETS BY MOUTH AT BEDTIME FOR SLEEP     Psychiatry:  Antidepressants - Heterocyclics (TCAs) Passed - 09/28/2021  2:57 PM      Passed - Completed PHQ-2 or PHQ-9 in the last 360 days      Passed - Valid encounter within last 6 months    Recent Outpatient Visits          2 months ago Annual physical exam   [place], NP   5 months ago Encounter for screening breast examination   [place], Megan P, DO   9 months ago Aortic atherosclerosis ([place])   [facility] Jon Billings, NP   1 year ago Elevated TSH   [facility] Myles Gip, DO   1 year ago Annual physical exam   [facility] Eulogio Bear, NP      Future Appointments            In 1 week Jon Billings, NP [facility], [place]

## 2021-10-11 ENCOUNTER — Ambulatory Visit: Payer: Medicare Other | Admitting: Nurse Practitioner

## 2021-10-14 ENCOUNTER — Ambulatory Visit (INDEPENDENT_AMBULATORY_CARE_PROVIDER_SITE_OTHER): Payer: Medicaid Other | Admitting: Nurse Practitioner

## 2021-10-14 ENCOUNTER — Encounter: Payer: Self-pay | Admitting: Nurse Practitioner

## 2021-10-14 ENCOUNTER — Other Ambulatory Visit: Payer: Self-pay

## 2021-10-14 VITALS — BP 128/81 | HR 94 | Temp 97.6°F | Ht 62.5 in | Wt 136.8 lb

## 2021-10-14 DIAGNOSIS — J439 Emphysema, unspecified: Secondary | ICD-10-CM | POA: Diagnosis not present

## 2021-10-14 DIAGNOSIS — F331 Major depressive disorder, recurrent, moderate: Secondary | ICD-10-CM

## 2021-10-14 DIAGNOSIS — N1831 Chronic kidney disease, stage 3a: Secondary | ICD-10-CM | POA: Diagnosis not present

## 2021-10-14 DIAGNOSIS — E782 Mixed hyperlipidemia: Secondary | ICD-10-CM | POA: Insufficient documentation

## 2021-10-14 DIAGNOSIS — I700 Atherosclerosis of aorta: Secondary | ICD-10-CM | POA: Diagnosis not present

## 2021-10-14 DIAGNOSIS — Z1211 Encounter for screening for malignant neoplasm of colon: Secondary | ICD-10-CM | POA: Diagnosis not present

## 2021-10-14 DIAGNOSIS — F419 Anxiety disorder, unspecified: Secondary | ICD-10-CM

## 2021-10-14 MED ORDER — AMITRIPTYLINE HCL 100 MG PO TABS: 100.0000 mg | ORAL_TABLET | Freq: Every day | ORAL | 1 refills | Status: DC

## 2021-10-14 NOTE — Assessment & Plan Note (Signed)
Chronic.  Controlled.  Continue with Crestor 5mg .  Labs ordered today.  Cardiac risk score discussed with patient at visit today. Recommend starting a Statin due to risk score of 14.6%.  Noted on Imaging June 2019.

## 2021-10-14 NOTE — Assessment & Plan Note (Signed)
Labs ordered today.  Will make recommendations based on lab results. ?

## 2021-10-14 NOTE — Assessment & Plan Note (Signed)
Improved.  Patient started stopped smoking a month or two ago.  She is using Symbicort to help with symptoms. Continue with current medication regimen.  Discussed how to properly use medication.

## 2021-10-14 NOTE — Assessment & Plan Note (Signed)
Chronic. Uses 1 Xanax during the day and two at night to sleep.  Patient has tried taking 1 and it didn't work for her.  Discussed risks of long term use of Benzodiazepine.  Discussed increased risk of taking medication for sleep.  She agrees to increase Amitriptyline to 100mg  nightly and try to cut back to 1 tab of Xanax at bedtime.  Will follow up in 3 months for reevaluation.

## 2021-10-14 NOTE — Assessment & Plan Note (Signed)
Chronic.  Controlled.  Continue with current medication regimen.  Labs ordered today.  Return to clinic in 3 months for reevaluation.  Call sooner if concerns arise.   

## 2021-10-14 NOTE — Progress Notes (Signed)
BP 128/81    Pulse 94    Temp 97.6 F (36.4 C) (Oral)    Ht 5' 2.5" (1.588 m)    Wt 136 lb 12.8 oz (62.1 kg)    SpO2 97%    BMI 24.62 kg/m    Subjective:    Patient ID: Jordan Cantu, female    DOB: 1950-09-23, 71 y.o.   MRN: [id number]  HPI: [name] is a 71 y.o. female  Chief Complaint  Patient presents with   Depression   Hyperlipidemia   Hypertension    CHRONIC KIDNEY DISEASE CKD status: controlled Medications renally dose: no Previous renal evaluation: no Pneumovax:  Up to Date Influenza Vaccine:  Up to Date  COPD COPD status: controlled Satisfied with current treatment?: yes Oxygen use: no Dyspnea frequency:  Cough frequency:  no Rescue inhaler frequency:   Limitation of activity: no Productive cough:  Last Spirometry:  Pneumovax: Up to Date Influenza: Up to Date Patient quit smoking.  She was using the Chantix and it helped her quit smoking in about 1-2 months.   ANXIETY/DEPRESSION Patient states she anxiety is okay.  She uses the Xanax 3x daily.  She feels like she needs it every day TID.  She states she takes 2 at night.  She has tried taking one but it hasn't helped her.  She hasn't tried anything else for sleep.  Does not want to change because it works well for her.     [place] Office Visit from 10/14/2021 in [facility]  PHQ-9 Total Score 7      GAD 7 : Generalized Anxiety Score 10/14/2021 07/11/2021 04/15/2021 08/22/2020  Nervous, Anxious, on Edge 0 2 3 0  Control/stop worrying 0 2 3 0  Worry too much - different things 0 1 3 0  Trouble relaxing 0 1 0 0  Restless 0 1 1 1   Easily annoyed or irritable 0 0 0 0  Afraid - awful might happen 0 1 0 0  Total GAD 7 Score 0 8 10 1   Anxiety Difficulty Not difficult at all Not difficult at all Not difficult at all Not difficult at all      Relevant past medical, surgical, family and social history reviewed and updated as indicated. Interim medical history since  our last visit reviewed. Allergies and medications reviewed and updated.  Review of Systems  Psychiatric/Behavioral:  Negative for dysphoric mood. The patient is nervous/anxious.    Per HPI unless specifically indicated above     Objective:    BP 128/81    Pulse 94    Temp 97.6 F (36.4 C) (Oral)    Ht 5' 2.5" (1.588 m)    Wt 136 lb 12.8 oz (62.1 kg)    SpO2 97%    BMI 24.62 kg/m   Wt Readings from Last 3 Encounters:  10/14/21 136 lb 12.8 oz (62.1 kg)  09/25/21 125 lb (56.7 kg)  07/11/21 131 lb 6.4 oz (59.6 kg)    Physical Exam Vitals and nursing note reviewed.  Constitutional:      General: She is not in acute distress.    Appearance: Normal appearance. She is normal weight. She is not ill-appearing, toxic-appearing or diaphoretic.  HENT:     Head: Normocephalic.     Right Ear: External ear normal.     Left Ear: External ear normal.     Nose: Nose normal.     Mouth/Throat:     Mouth: Mucous membranes are moist.  Pharynx: Oropharynx is clear.  Eyes:     General:        Right eye: No discharge.        Left eye: No discharge.     Extraocular Movements: Extraocular movements intact.     Conjunctiva/sclera: Conjunctivae normal.     Pupils: Pupils are equal, round, and reactive to light.  Cardiovascular:     Rate and Rhythm: Normal rate and regular rhythm.     Heart sounds: No murmur heard. Pulmonary:     Effort: Pulmonary effort is normal. No respiratory distress.     Breath sounds: Normal breath sounds. No wheezing or rales.  Musculoskeletal:     Cervical back: Normal range of motion and neck supple.  Skin:    General: Skin is warm and dry.     Capillary Refill: Capillary refill takes less than 2 seconds.  Neurological:     General: No focal deficit present.     Mental Status: She is alert and oriented to person, place, and time. Mental status is at baseline.  Psychiatric:        Mood and Affect: Mood normal.        Behavior: Behavior normal.        Thought  Content: Thought content normal.        Judgment: Judgment normal.    Results for orders placed or performed in visit on 07/11/21  Microscopic Examination   Urine  Result Value Ref Range   WBC, UA 0-5 0 - 5 /hpf   RBC 0-2 0 - 2 /hpf   Epithelial Cells (non renal) 0-10 0 - 10 /hpf   Bacteria, UA Many (A) None seen/Few  CBC with Differential/Platelet  Result Value Ref Range   WBC 9.6 3.4 - 10.8 x10E3/uL   RBC 4.01 3.77 - 5.28 x10E6/uL   Hemoglobin 12.8 11.1 - 15.9 g/dL   Hematocrit 38.2 34.0 - 46.6 %   MCV 95 79 - 97 fL   MCH 31.9 26.6 - 33.0 pg   MCHC 33.5 31.5 - 35.7 g/dL   RDW 13.0 11.7 - 15.4 %   Platelets 328 150 - 450 x10E3/uL   Neutrophils 70 Not Estab. %   Lymphs 19 Not Estab. %   Monocytes 8 Not Estab. %   Eos 2 Not Estab. %   Basos 1 Not Estab. %   Neutrophils Absolute 6.7 1.4 - 7.0 x10E3/uL   Lymphocytes Absolute 1.8 0.7 - 3.1 x10E3/uL   Monocytes Absolute 0.8 0.1 - 0.9 x10E3/uL   EOS (ABSOLUTE) 0.1 0.0 - 0.4 x10E3/uL   Basophils Absolute 0.1 0.0 - 0.2 x10E3/uL   Immature Granulocytes 0 Not Estab. %   Immature Grans (Abs) 0.0 0.0 - 0.1 x10E3/uL  Comprehensive metabolic panel  Result Value Ref Range   Glucose 97 70 - 99 mg/dL   BUN 12 8 - 27 mg/dL   Creatinine, Ser 1.21 (H) 0.57 - 1.00 mg/dL   eGFR 49 (L) >59 mL/min/1.73   BUN/Creatinine Ratio 10 (L) 12 - 28   Sodium 138 134 - 144 mmol/L   Potassium 4.6 3.5 - 5.2 mmol/L   Chloride 102 96 - 106 mmol/L   CO2 22 20 - 29 mmol/L   Calcium 9.7 8.7 - 10.3 mg/dL   Total Protein 6.6 6.0 - 8.5 g/dL   Albumin 4.1 3.8 - 4.8 g/dL   Globulin, Total 2.5 1.5 - 4.5 g/dL   Albumin/Globulin Ratio 1.6 1.2 - 2.2   Bilirubin Total 0.2 0.0 -  1.2 mg/dL   Alkaline Phosphatase 143 (H) 44 - 121 IU/L   AST 15 0 - 40 IU/L   ALT 12 0 - 32 IU/L  Lipid panel  Result Value Ref Range   Cholesterol, Total 178 100 - 199 mg/dL   Triglycerides 91 0 - 149 mg/dL   HDL 57 >39 mg/dL   VLDL Cholesterol Cal 17 5 - 40 mg/dL   LDL Chol Calc  (NIH) 104 (H) 0 - 99 mg/dL   Chol/HDL Ratio 3.1 0.0 - 4.4 ratio  TSH  Result Value Ref Range   TSH 5.670 (H) 0.450 - 4.500 uIU/mL  Urinalysis, Routine w reflex microscopic  Result Value Ref Range   Specific Gravity, UA 1.015 1.005 - 1.030   pH, UA 5.0 5.0 - 7.5   Color, UA Yellow Yellow   Appearance Ur Cloudy (A) Clear   Leukocytes,UA Trace (A) Negative   Protein,UA Negative Negative/Trace   Glucose, UA Negative Negative   Ketones, UA Negative Negative   RBC, UA Trace (A) Negative   Bilirubin, UA Negative Negative   Urobilinogen, Ur 0.2 0.2 - 1.0 mg/dL   Nitrite, UA Negative Negative   Microscopic Examination See below:       Assessment & Plan:   Problem List Items Addressed This Visit       Cardiovascular and Mediastinum   Aortic atherosclerosis ([place]) - Primary    Chronic.  Controlled.  Continue with Crestor 57m.  Labs ordered today.  Cardiac risk score discussed with patient at visit today. Recommend starting a Statin due to risk score of 14.6%.  Noted on Imaging June 2019.        Respiratory   Emphysema, unspecified ([place]    Improved.  Patient started stopped smoking a month or two ago.  She is using Symbicort to help with symptoms. Continue with current medication regimen.  Discussed how to properly use medication.       Relevant Orders   Comp Met (CMET)     Genitourinary   Chronic kidney disease, stage 3a ([place]    Labs ordered today. Will make recommendations based on lab results.       Relevant Orders   Comp Met (CMET)     Other   Anxiety    Chronic. Uses 1 Xanax during the day and two at night to sleep.  Patient has tried taking 1 and it didn't work for her.  Discussed risks of long term use of Benzodiazepine.  Discussed increased risk of taking medication for sleep.  She agrees to increase Amitriptyline to 1056mnightly and try to cut back to 1 tab of Xanax at bedtime.  Will follow up in 3 months for reevaluation.      Relevant Medications    amitriptyline (ELAVIL) 100 MG tablet   Other Relevant Orders   Comp Met (CMET)   Depression    Chronic.  Controlled.  Continue with current medication regimen.  Labs ordered today.  Return to clinic in 3 months for reevaluation.  Call sooner if concerns arise.        Relevant Medications   amitriptyline (ELAVIL) 100 MG tablet   Other Relevant Orders   Comp Met (CMET)   Mixed hyperlipidemia    Chronic.  Controlled.  Continue with current medication regimen on Crestor 69m70maily.  Labs ordered today.  Return to clinic in 3 months for reevaluation.  Call sooner if concerns arise.        Relevant Orders   Lipid Profile  Other Visit Diagnoses     Screening for colon cancer       Relevant Orders   Cologuard        Follow up plan: Return in about 3 months (around 01/11/2022) for Depression/Anxiety FU, COPD.

## 2021-10-14 NOTE — Assessment & Plan Note (Signed)
Chronic.  Controlled.  Continue with current medication regimen on Crestor 5mg  daily.  Labs ordered today.  Return to clinic in 3 months for reevaluation.  Call sooner if concerns arise.

## 2021-10-15 LAB — LIPID PANEL
Chol/HDL Ratio: 3.2 ratio (ref 0.0–4.4)
Cholesterol, Total: 212 mg/dL — ABNORMAL HIGH (ref 100–199)
HDL: 67 mg/dL (ref >39)
LDL Chol Calc (NIH): 128 mg/dL — ABNORMAL HIGH (ref 0–99)
Triglycerides: 97 mg/dL (ref 0–149)
VLDL Cholesterol Cal: 17 mg/dL (ref 5–40)

## 2021-10-15 LAB — COMPREHENSIVE METABOLIC PANEL
ALT: 22 IU/L (ref 0–32)
AST: 25 IU/L (ref 0–40)
Albumin/Globulin Ratio: 1.6 (ref 1.2–2.2)
Albumin: 4.4 g/dL (ref 3.8–4.8)
Alkaline Phosphatase: 132 IU/L — ABNORMAL HIGH (ref 44–121)
BUN/Creatinine Ratio: 10 — ABNORMAL LOW (ref 12–28)
BUN: 12 mg/dL (ref 8–27)
Bilirubin Total: 0.3 mg/dL (ref 0.0–1.2)
CO2: 22 mmol/L (ref 20–29)
Calcium: 9.6 mg/dL (ref 8.7–10.3)
Chloride: 104 mmol/L (ref 96–106)
Creatinine, Ser: 1.2 mg/dL — ABNORMAL HIGH (ref 0.57–1.00)
Globulin, Total: 2.8 g/dL (ref 1.5–4.5)
Glucose: 88 mg/dL (ref 70–99)
Potassium: 4.8 mmol/L (ref 3.5–5.2)
Sodium: 141 mmol/L (ref 134–144)
Total Protein: 7.2 g/dL (ref 6.0–8.5)
eGFR: 49 mL/min/{1.73_m2} — ABNORMAL LOW (ref >59)

## 2021-10-15 NOTE — Progress Notes (Signed)
Please let patient know that her lab work looks good.  Her Kidney function remains stable.  Cholesterol is elevated.  I recommend a low fat diet and continue with the Crestor.  Follow up as discussed.

## 2021-10-16 ENCOUNTER — Other Ambulatory Visit: Payer: Self-pay | Admitting: Nurse Practitioner

## 2021-10-17 NOTE — Telephone Encounter (Signed)
Requested medications are due for refill today.  yes  Requested medications are on the active medications list.  yes  Last refill. 07/11/2021 #90 2 refills  Future visit scheduled.   yes  Notes to clinic.  Medication not delegated.    Requested Prescriptions  Pending Prescriptions Disp Refills   ALPRAZolam (XANAX) 0.5 MG tablet [Pharmacy Med Name: ALPRAZOLAM 0.5MG  TABLETS] 90 tablet     Sig: TAKE 1 TABLET(0.5 MG) BY MOUTH THREE TIMES DAILY AS NEEDED FOR ANXIETY     Not Delegated - Psychiatry: Anxiolytics/Hypnotics 2 Failed - 10/16/2021  6:05 PM      Failed - This refill cannot be delegated      Passed - Urine Drug Screen completed in last 360 days      Passed - Patient is not pregnant      Passed - Valid encounter within last 6 months    Recent Outpatient Visits           3 days ago Aortic atherosclerosis ([place])   [facility] Jon Billings, NP   3 months ago Annual physical exam   [place], NP   6 months ago Encounter for screening breast examination   [place], Megan P, DO   9 months ago Aortic atherosclerosis ([place])   [facility] Jon Billings, NP   1 year ago Elevated TSH   [place] Myles Gip, DO       Future Appointments             In 2 months Jon Billings, NP [facility] [place]

## 2021-12-16 ENCOUNTER — Telehealth: Payer: Medicare Other

## 2021-12-16 ENCOUNTER — Telehealth: Payer: Self-pay | Admitting: Licensed Clinical Social Worker

## 2021-12-16 NOTE — Telephone Encounter (Signed)
? ?   Clinical Social Work  ?Care Management  ? Phone Outreach  ? ? ?12/16/2021 ?Name: Jordan Cantu MRN: [id number] DOB: 03/27/1951 ? ?Jordan Cantu is a 71 y.o. year old female who is a primary care patient of Jon Billings, NP .  ? ?Reason for referral: Mental Health Counseling and Resources.   ? ?F/U phone call today to assess needs, progress and barriers with care plan goals.   Telephone outreach was unsuccessful. A HIPPA compliant phone message was left for the patient providing contact information and requesting a return call.  ? ?Plan:CCM LCSW will wait for return call. If no return call is received, Will route chart to Care Guide to see if patient would like to reschedule phone appointment  ? ?Review of patient status, including review of consultants reports, relevant laboratory and other test results, and collaboration with appropriate care team members and the patient's provider was performed as part of comprehensive patient evaluation and provision of care management services.   ? ?Christa See, MSW, LCSW ?[place] Management ?[facility] ?[email or web address] ?Phone [phone] ?4:04 PM ? ? ? ?  ? ? ? ? ?

## 2021-12-25 ENCOUNTER — Telehealth: Payer: Self-pay

## 2021-12-25 NOTE — Chronic Care Management (AMB) (Signed)
?  Care Management  ? ?Note ? ?12/25/2021 ?Name: Jordan Cantu MRN: [id number] DOB: September 03, 1950 ? ?Jordan Cantu is a 71 y.o. year old female who is a primary care patient of Jon Billings, NP and is actively engaged with the care management team. I reached out to [name] by phone today to assist with re-scheduling a follow up visit with the Licensed Clinical Social Worker ? ?Follow up plan: ?Unsuccessful telephone outreach attempt made. A HIPAA compliant phone message was left for the patient providing contact information and requesting a return call.  ?The care management team will reach out to the patient again over the next 7 days.  ?If patient returns call to provider office, please advise to call [place]  at [phone] ? ?Noreene Larsson, RMA ?Care Guide, Embedded Care Coordination ?[facility]  Care Management  ?[place], [place] [address] ?Direct Dial: [phone] ?[email or web address] ?[email or web address]: [email or web address]  ? ?

## 2022-01-02 NOTE — Chronic Care Management (AMB) (Signed)
?  Chronic Care Management ?Note ? ?01/02/2022 ?Name: Jordan Cantu MRN: [id number] DOB: 1951/08/16 ? ?Jordan Cantu is a 71 y.o. year old female who is a primary care patient of Jon Billings, NP and is actively engaged with the care management team. I reached out to [name] by phone today to assist with re-scheduling a follow up visit with the Licensed Clinical Social Worker ? ?Follow up plan: ?Telephone appointment with care management team member scheduled for:01/08/2022 ? ?Noreene Larsson, RMA ?Care Guide, Embedded Care Coordination ?[facility]  Care [facility]  ?[place], [place] [address] ?Direct Dial: [phone] ?Museum/gallery [email or web address] ?Website: [email or web address]  ? ?

## 2022-01-07 ENCOUNTER — Other Ambulatory Visit: Payer: Self-pay | Admitting: Nurse Practitioner

## 2022-01-08 ENCOUNTER — Ambulatory Visit (INDEPENDENT_AMBULATORY_CARE_PROVIDER_SITE_OTHER): Payer: Medicaid Other | Admitting: Licensed Clinical Social Worker

## 2022-01-08 DIAGNOSIS — G4700 Insomnia, unspecified: Secondary | ICD-10-CM

## 2022-01-08 DIAGNOSIS — N1831 Chronic kidney disease, stage 3a: Secondary | ICD-10-CM

## 2022-01-08 DIAGNOSIS — F331 Major depressive disorder, recurrent, moderate: Secondary | ICD-10-CM

## 2022-01-08 DIAGNOSIS — F419 Anxiety disorder, unspecified: Secondary | ICD-10-CM

## 2022-01-08 NOTE — Patient Instructions (Signed)
Visit Information ? ?Thank you for taking time to visit with me today. Please don't hesitate to contact me if I can be of assistance to you before our next scheduled telephone appointment. ? ?Following are the goals we discussed today:  ?Patient Goals/Self-Care Activities: Over the next 120 days ?Continue with compliance of taking medication  ?Utilize healthy coping skills discussed ?Attend scheduled appointments with providers ?Contact PCP office with any questions or concerns ? ?Our next appointment is by telephone on 02/26/22 at 10:00 AM ? ?Please call the care guide team at [phone] if you need to cancel or reschedule your appointment.  ? ?If you are experiencing a Mental Health or [place] or need someone to talk to, please call the Suicide and Crisis Lifeline: 988 ?call 911  ? ?The patient verbalized understanding of instructions, educational materials, and care plan provided today and declined offer to receive copy of patient instructions, educational materials, and care plan.  ? ?Christa See, MSW, LCSW ?[facility] [place] Management ?[facility] ?[email or web address] ?Phone [phone] ?9:42 AM ?  ?

## 2022-01-08 NOTE — Telephone Encounter (Signed)
Requested Prescriptions  ?Pending Prescriptions Disp Refills  ?? rosuvastatin (CRESTOR) 5 MG tablet [Pharmacy Med Name: ROSUVASTATIN '5MG'$  TABLETS] 90 tablet 1  ?  Sig: TAKE 1 TABLET(5 MG) BY MOUTH DAILY  ?  ? Cardiovascular:  Antilipid - Statins 2 Failed - 01/07/2022  5:50 AM  ?  ?  Failed - Cr in normal range and within 360 days  ?  Creatinine  ?Date Value Ref Range Status  ?12/27/2020 104.4 20.0 - 300.0 mg/dL Final  ?09/03/2014 0.95 0.60 - 1.30 mg/dL Final  ? ?Creatinine, Ser  ?Date Value Ref Range Status  ?10/14/2021 1.20 (H) 0.57 - 1.00 mg/dL Final  ?   ?  ?  Failed - Lipid Panel in normal range within the last 12 months  ?  Cholesterol, Total  ?Date Value Ref Range Status  ?10/14/2021 212 (H) 100 - 199 mg/dL Final  ? ?LDL Chol Calc (NIH)  ?Date Value Ref Range Status  ?10/14/2021 128 (H) 0 - 99 mg/dL Final  ? ?HDL  ?Date Value Ref Range Status  ?10/14/2021 67 >39 mg/dL Final  ? ?Triglycerides  ?Date Value Ref Range Status  ?10/14/2021 97 0 - 149 mg/dL Final  ? ?  ?  ?  Passed - Patient is not pregnant  ?  ?  Passed - Valid encounter within last 12 months  ?  Recent Outpatient Visits   ?      ? 2 months ago Aortic atherosclerosis ([place])  ? [place], NP  ? 6 months ago Annual physical exam  ? [place], NP  ? 8 months ago Encounter for screening breast examination  ? [place], DO  ? 1 year ago Aortic atherosclerosis ([place])  ? [place], NP  ? 1 year ago Elevated TSH  ? [facility] Myles Gip, DO  ?  ?  ?Future Appointments   ?        ? In 2 days Jon Billings, NP [facility]  ?  ? ?  ?  ?  ? ? ?

## 2022-01-08 NOTE — Chronic Care Management (AMB) (Signed)
?Chronic Care Management  ? ? Clinical Social Work Note ? ?01/08/2022 ?Name: Jordan Cantu MRN: [id number] DOB: 1951/08/10 ? ?Jordan Cantu is a 71 y.o. year old female who is a primary care patient of Jon Billings, NP. The CCM team was consulted to assist the patient with chronic disease management and/or care coordination needs related to: Mental Health Counseling and Resources.  ? ?Engaged with patient by telephone for follow up visit in response to provider referral for social work chronic care management and care coordination services.  ? ?Consent to Services:  ?The patient was given information about Chronic Care Management services, agreed to services, and gave verbal consent prior to initiation of services.  Please see initial visit note for detailed documentation.  ? ?Patient agreed to services and consent obtained.  ? ?Assessment: Review of patient past medical history, allergies, medications, and health status, including review of relevant consultants reports was performed today as part of a comprehensive evaluation and provision of chronic care management and care coordination services.    ? ?SDOH (Social Determinants of Health) assessments and interventions performed:   ? ?Advanced Directives Status: Not addressed in this encounter. ? ?CCM Care Plan ? ?Allergies  ?Allergen Reactions  ? Bupropion Nausea Only  ? ? ?Outpatient Encounter Medications as of 01/08/2022  ?Medication Sig  ? albuterol (PROVENTIL) (2.5 MG/3ML) 0.083% nebulizer solution Take 3 mLs (2.5 mg total) by nebulization every 6 (six) hours as needed for wheezing or shortness of breath.  ? albuterol (VENTOLIN HFA) 108 (90 Base) MCG/ACT inhaler INHALE 2 PUFFS INTO THE LUNGS EVERY 6 HOURS AS NEEDED FOR WHEEZING OR SHORTNESS OF BREATH  ? ALPRAZolam (XANAX) 0.5 MG tablet TAKE 1 TABLET(0.5 MG) BY MOUTH THREE TIMES DAILY AS NEEDED FOR ANXIETY  ? amitriptyline (ELAVIL) 100 MG tablet Take 1 tablet (100 mg total) by mouth at  bedtime.  ? rosuvastatin (CRESTOR) 5 MG tablet TAKE 1 TABLET(5 MG) BY MOUTH DAILY  ? SYMBICORT 160-4.5 MCG/ACT inhaler INHALE 2 PUFFS INTO THE LUNGS TWICE DAILY  ? varenicline (CHANTIX) 1 MG tablet Take 1 tablet (1 mg total) by mouth 2 (two) times daily.  ? [DISCONTINUED] rosuvastatin (CRESTOR) 5 MG tablet Take 1 tablet (5 mg total) by mouth daily.  ? ?No facility-administered encounter medications on file as of 01/08/2022.  ? ? ?Patient Active Problem List  ? Diagnosis Date Noted  ? Mixed hyperlipidemia 10/14/2021  ? Aortic atherosclerosis ([place]) 11/21/2020  ? Chronic kidney disease, stage 3a ([place]) 05/22/2020  ? Age-related osteoporosis without current pathological fracture 05/21/2020  ? Former cigarette smoker 01/20/2019  ? Controlled substance agreement signed 04/16/2018  ? Spleen laceration 03/11/2018  ? Benign neoplasm of cecum   ? Benign neoplasm of transverse colon   ? Rectal polyp   ? Polyp of sigmoid colon   ? Emphysema, unspecified ([place]) 12/28/2017  ? Anxiety 12/28/2017  ? Depression 12/28/2017  ? Insomnia 12/28/2017  ? ? ?Conditions to be addressed/monitored: CKD Stage 3a, Anxiety, Depression, and Insomnia ? ?Care Plan : LCSW Plan of Care  ?Updates made by Jordan [place], LCSW since 01/08/2022 12:00 AM  ?  ? ?Problem: Symptoms (Anxiety)   ?  ? ?Goal: Anxiety Symptoms Monitored and Managed   ?Start Date: 06/28/2021  ?Expected End Date: 03/31/2022  ?This Visit's Progress: On track  ?Recent Progress: On track  ?Priority: High  ?Note:   ?Current barriers:   ?Chronic Mental Health needs related to Anxiety ?Mental Health Concerns  ?Needs Support, Education, and Care Coordination  in order to meet unmet mental health needs. ?Clinical Goal(s): demonstrate a reduction in symptoms related to :Anxiety with Excessive Worry,   ?Clinical Interventions:  ?Assessed patient's previous and current treatment, coping skills, support system and barriers to care  ?Pt reports that she hasn't been sleeping well noting she  discontinued Elavil due to concerns for her kidneys ?LCSW discussed sleep hygiene strategies to promote quality sleep and functioning ?Pt denies mood swings or irritability. She continues to utilize xanax to assist with management of anxiety symptoms ?CCM LCSW commended patient and reviewed her motivation factors to assist with continued sobriety from tobacco products ?CCM LCSW discussed grounding strategies to assist with management of symptoms ?Patient receives support from son and daughter.  ?Depression screen reviewed  ?Mindfulness or Relaxation training provided ?Active listening / Reflection utilized  ?Emotional Support Provided ?Provided psychoeducation for mental health needs  ?Reviewed mental health medications with patient and discussed importance of compliance:  ?Quality of sleep assessed & Sleep Hygiene techniques promoted  ?Participation in counseling encouraged  ?Verbalization of feelings encouraged  ; ?Inter-disciplinary care team collaboration (see longitudinal plan of care) ?Patient Goals/Self-Care Activities: Over the next 120 days ?Continue with compliance of taking medication  ?Utilize healthy coping skills discussed ?Attend scheduled appointments with providers ?Contact PCP office with any questions or concerns ? ? ?  ?  ? ?Follow Up Plan: Appointment scheduled for SW follow up with client by phone on: 02/26/22 ? ?Christa See, MSW, LCSW ?[place] Management ?[facility] ?[email or web address] ?Phone [phone] ?9:43 AM ? ? ? ?

## 2022-01-10 ENCOUNTER — Ambulatory Visit (INDEPENDENT_AMBULATORY_CARE_PROVIDER_SITE_OTHER): Payer: Medicare Other | Admitting: Nurse Practitioner

## 2022-01-10 ENCOUNTER — Encounter: Payer: Self-pay | Admitting: Nurse Practitioner

## 2022-01-10 VITALS — BP 120/77 | HR 93 | Temp 97.5°F | Wt 134.0 lb

## 2022-01-10 DIAGNOSIS — E782 Mixed hyperlipidemia: Secondary | ICD-10-CM | POA: Diagnosis not present

## 2022-01-10 DIAGNOSIS — I700 Atherosclerosis of aorta: Secondary | ICD-10-CM | POA: Diagnosis not present

## 2022-01-10 DIAGNOSIS — N1831 Chronic kidney disease, stage 3a: Secondary | ICD-10-CM

## 2022-01-10 DIAGNOSIS — F419 Anxiety disorder, unspecified: Secondary | ICD-10-CM | POA: Diagnosis not present

## 2022-01-10 DIAGNOSIS — J439 Emphysema, unspecified: Secondary | ICD-10-CM

## 2022-01-10 DIAGNOSIS — F331 Major depressive disorder, recurrent, moderate: Secondary | ICD-10-CM

## 2022-01-10 MED ORDER — TRAZODONE HCL 50 MG PO TABS: 25.0000 mg | ORAL_TABLET | Freq: Every evening | ORAL | 0 refills | Status: DC | PRN

## 2022-01-10 MED ORDER — ALPRAZOLAM 0.5 MG PO TABS: ORAL_TABLET | ORAL | 0 refills | Status: AC

## 2022-01-10 NOTE — Assessment & Plan Note (Signed)
Chronic.  Controlled.  Continue with current medication regimen.  Labs ordered today.  Return to clinic in 6 months for reevaluation.  Call sooner if concerns arise.  ? ?

## 2022-01-10 NOTE — Assessment & Plan Note (Signed)
Chronic.  Controlled.  Continue with Crestor '5mg'$ .  Will work to increase dose at future visits.  Labs ordered today.  Noted on Imaging June 2019. ?

## 2022-01-10 NOTE — Progress Notes (Signed)
? ?BP 120/77   Pulse 93   Temp (!) 97.5 ?F (36.4 ?C) (Oral)   Wt 134 lb (60.8 kg)   SpO2 96%   BMI 24.12 kg/m?   ? ?Subjective:  ? ? Patient ID: Jordan Cantu, female    DOB: 1951/01/19, 71 y.o.   MRN: [id number] ? ?HPI: ?Jordan Cantu is a 72 y.o. female ? ?Chief Complaint  ?Patient presents with  ? Depression  ? Anxiety  ?  Pt states still taking xanax TID, because she cannot sleep well through the night. She states she thinks she does not have kidney cancer because she has been put off the amytripyline but took some last night   ? COPD  ? ? ?CHRONIC KIDNEY DISEASE ?CKD status: controlled ?Medications renally dose: no ?Previous renal evaluation: no ?Pneumovax:  Up to Date ?Influenza Vaccine:  Up to Date ?Patient has stopped the amitriptyline because she read that it was bad for her kidneys.  She has been peeing a lot better since she stopped the medication.  States she has been having some withdrawal since stopping the medication.  She is having trouble sleeping and using xanax to help her sleep. ? ?COPD ?COPD status: controlled ?Satisfied with current treatment?: yes ?Oxygen use: no ?Dyspnea frequency:  ?Cough frequency:  no ?Rescue inhaler frequency:   ?Limitation of activity: no ?Productive cough:  ?Last Spirometry:  ?Pneumovax: Up to Date ?Influenza: Up to Date ? ? ?ANXIETY/DEPRESSION ?Patient states she anxiety is okay.  She uses the Xanax 3x daily.  She feels like she needs it every day TID.  She states she takes 2 at night.  She has tried taking one but it hasn't helped her.  She hasn't tried anything else for sleep.  Does not want to change because it works well for her.  She is willing to try something else for sleep.  She does not feel depressed just not sleeping well.  ? ? ?[place] Office Visit from 01/10/2022 in [place]  ?PHQ-9 Total Score 21  ? ?  ? ? ?  01/10/2022  ?  1:53 PM 10/14/2021  ? 11:11 AM 07/11/2021  ?  8:23 AM 04/15/2021  ? 11:38 AM  ?GAD 7 :  Generalized Anxiety Score  ?Nervous, Anxious, on Edge 3 0 2 3  ?Control/stop worrying 3 0 2 3  ?Worry too much - different things 3 0 1 3  ?Trouble relaxing 3 0 1 0  ?Restless 2 0 1 1  ?Easily annoyed or irritable 3 0 0 0  ?Afraid - awful might happen 2 0 1 0  ?Total GAD 7 Score 19 0 8 10  ?Anxiety Difficulty Somewhat difficult Not difficult at all Not difficult at all Not difficult at all  ? ? ? ? ?Relevant past medical, surgical, family and social history reviewed and updated as indicated. Interim medical history since our last visit reviewed. ?Allergies and medications reviewed and updated. ? ?Review of Systems  ?Respiratory:  Negative for shortness of breath.   ?Psychiatric/Behavioral:  Positive for sleep disturbance. Negative for dysphoric mood. The patient is nervous/anxious.   ? ?Per HPI unless specifically indicated above ? ?   ?Objective:  ?  ?BP 120/77   Pulse 93   Temp (!) 97.5 ?F (36.4 ?C) (Oral)   Wt 134 lb (60.8 kg)   SpO2 96%   BMI 24.12 kg/m?   ?Wt Readings from Last 3 Encounters:  ?01/10/22 134 lb (60.8 kg)  ?10/14/21 136 lb 12.8  oz (62.1 kg)  ?09/25/21 125 lb (56.7 kg)  ?  ?Physical Exam ?Vitals and nursing note reviewed.  ?Constitutional:   ?   General: She is not in acute distress. ?   Appearance: Normal appearance. She is normal weight. She is not ill-appearing, toxic-appearing or diaphoretic.  ?HENT:  ?   Head: Normocephalic.  ?   Right Ear: External ear normal.  ?   Left Ear: External ear normal.  ?   Nose: Nose normal.  ?   Mouth/Throat:  ?   Mouth: Mucous membranes are moist.  ?   Pharynx: Oropharynx is clear.  ?Eyes:  ?   General:     ?   Right eye: No discharge.     ?   Left eye: No discharge.  ?   Extraocular Movements: Extraocular movements intact.  ?   Conjunctiva/sclera: Conjunctivae normal.  ?   Pupils: Pupils are equal, round, and reactive to light.  ?Cardiovascular:  ?   Rate and Rhythm: Normal rate and regular rhythm.  ?   Heart sounds: No murmur heard. ?Pulmonary:  ?    Effort: Pulmonary effort is normal. No respiratory distress.  ?   Breath sounds: Normal breath sounds. No wheezing or rales.  ?Musculoskeletal:  ?   Cervical back: Normal range of motion and neck supple.  ?Skin: ?   General: Skin is warm and dry.  ?   Capillary Refill: Capillary refill takes less than 2 seconds.  ?Neurological:  ?   General: No focal deficit present.  ?   Mental Status: She is alert and oriented to person, place, and time. Mental status is at baseline.  ?Psychiatric:     ?   Mood and Affect: Mood normal.     ?   Behavior: Behavior normal.     ?   Thought Content: Thought content normal.     ?   Judgment: Judgment normal.  ? ? ?Results for orders placed or performed in visit on 10/14/21  ?Comp Met (CMET)  ?Result Value Ref Range  ? Glucose 88 70 - 99 mg/dL  ? BUN 12 8 - 27 mg/dL  ? Creatinine, Ser 1.20 (H) 0.57 - 1.00 mg/dL  ? eGFR 49 (L) >59 mL/min/1.73  ? BUN/Creatinine Ratio 10 (L) 12 - 28  ? Sodium 141 134 - 144 mmol/L  ? Potassium 4.8 3.5 - 5.2 mmol/L  ? Chloride 104 96 - 106 mmol/L  ? CO2 22 20 - 29 mmol/L  ? Calcium 9.6 8.7 - 10.3 mg/dL  ? Total Protein 7.2 6.0 - 8.5 g/dL  ? Albumin 4.4 3.8 - 4.8 g/dL  ? Globulin, Total 2.8 1.5 - 4.5 g/dL  ? Albumin/Globulin Ratio 1.6 1.2 - 2.2  ? Bilirubin Total 0.3 0.0 - 1.2 mg/dL  ? Alkaline Phosphatase 132 (H) 44 - 121 IU/L  ? AST 25 0 - 40 IU/L  ? ALT 22 0 - 32 IU/L  ?Lipid Profile  ?Result Value Ref Range  ? Cholesterol, Total 212 (H) 100 - 199 mg/dL  ? Triglycerides 97 0 - 149 mg/dL  ? HDL 67 >39 mg/dL  ? VLDL Cholesterol Cal 17 5 - 40 mg/dL  ? LDL Chol Calc (NIH) 128 (H) 0 - 99 mg/dL  ? Chol/HDL Ratio 3.2 0.0 - 4.4 ratio  ? ?   ?Assessment & Plan:  ? ?Problem List Items Addressed This Visit   ? ?  ? Cardiovascular and Mediastinum  ? Aortic atherosclerosis ([place]) - Primary  ?  Chronic.  Controlled.  Continue with Crestor 8m.  Will work to increase dose at future visits.  Labs ordered today.  Noted on Imaging June 2019. ? ?  ?  ? Relevant Orders  ? Comp  Met (CMET)  ?  ? Respiratory  ? Emphysema, unspecified ([place]  ?  Improved.  Patient has stopped smoking.  Praised for smoking cessation.  Recommend continued smoking cessation.  She is using Symbicort to help with symptoms. Continue with current medication regimen.  Discussed how to properly use medication.  ? ?  ?  ? Relevant Orders  ? Comp Met (CMET)  ?  ? Genitourinary  ? Chronic kidney disease, stage 3a ([place]  ?  Labs ordered today. Will make recommendations based on lab results. Patient has stopped amitriptyline. Has noticed an improvement in urination.   ? ?  ?  ?  ? Other  ? Anxiety  ?  Chronic. Has been having trouble sleeping since stopping Amitriptyline.  She has been using Xanax to sleep.  Will start Trazodone to see if sleep improves.  Discussed using the xanax for PRN anxiety versus nightly for sleep.  Discussed risks of long term use of Benzodiazepine.  Discussed increased risk of taking medication for sleep.   Will follow up in 31 months for reevaluation. ? ?  ?  ? Relevant Medications  ? traZODone (DESYREL) 50 MG tablet  ? ALPRAZolam (XANAX) 0.5 MG tablet  ? Depression  ?  Chronic.  Controlled.  Continue with current medication regimen.  Labs ordered today.  Return to clinic in 6 months for reevaluation.  Call sooner if concerns arise.  ? ? ?  ?  ? Relevant Medications  ? traZODone (DESYREL) 50 MG tablet  ? ALPRAZolam (XANAX) 0.5 MG tablet  ? Mixed hyperlipidemia  ?  Chronic.  Controlled.  Continue with current medication regimen on Crestor 558mdaily.  Labs ordered today.  Return to clinic in 3 months for reevaluation.  Call sooner if concerns arise.  ? ? ?  ?  ? Relevant Orders  ? Lipid Profile  ?  ? ?Follow up plan: ?Return in about 1 month (around 02/10/2022) for Sleep check up. ? ? ? ? ? ? ?

## 2022-01-10 NOTE — Assessment & Plan Note (Signed)
Improved.  Patient has stopped smoking.  Praised for smoking cessation.  Recommend continued smoking cessation.  She is using Symbicort to help with symptoms. Continue with current medication regimen.  Discussed how to properly use medication.  ?

## 2022-01-10 NOTE — Assessment & Plan Note (Signed)
Chronic.  Controlled.  Continue with current medication regimen on Crestor '5mg'$  daily.  Labs ordered today.  Return to clinic in 3 months for reevaluation.  Call sooner if concerns arise.  ? ?

## 2022-01-10 NOTE — Assessment & Plan Note (Signed)
Labs ordered today. Will make recommendations based on lab results. Patient has stopped amitriptyline. Has noticed an improvement in urination.   ?

## 2022-01-10 NOTE — Assessment & Plan Note (Signed)
Chronic. Has been having trouble sleeping since stopping Amitriptyline.  She has been using Xanax to sleep.  Will start Trazodone to see if sleep improves.  Discussed using the xanax for PRN anxiety versus nightly for sleep.  Discussed risks of long term use of Benzodiazepine.  Discussed increased risk of taking medication for sleep.   Will follow up in 31 months for reevaluation. ?

## 2022-01-11 LAB — COMPREHENSIVE METABOLIC PANEL
ALT: 12 IU/L (ref 0–32)
AST: 19 IU/L (ref 0–40)
Albumin/Globulin Ratio: 1.5 (ref 1.2–2.2)
Albumin: 3.8 g/dL (ref 3.8–4.8)
Alkaline Phosphatase: 107 IU/L (ref 44–121)
BUN/Creatinine Ratio: 9 — ABNORMAL LOW (ref 12–28)
BUN: 12 mg/dL (ref 8–27)
Bilirubin Total: 0.2 mg/dL (ref 0.0–1.2)
CO2: 21 mmol/L (ref 20–29)
Calcium: 9.3 mg/dL (ref 8.7–10.3)
Chloride: 103 mmol/L (ref 96–106)
Creatinine, Ser: 1.32 mg/dL — ABNORMAL HIGH (ref 0.57–1.00)
Globulin, Total: 2.6 g/dL (ref 1.5–4.5)
Glucose: 81 mg/dL (ref 70–99)
Potassium: 4.6 mmol/L (ref 3.5–5.2)
Sodium: 137 mmol/L (ref 134–144)
Total Protein: 6.4 g/dL (ref 6.0–8.5)
eGFR: 43 mL/min/{1.73_m2} — ABNORMAL LOW (ref >59)

## 2022-01-11 LAB — LIPID PANEL
Chol/HDL Ratio: 3.7 ratio (ref 0.0–4.4)
Cholesterol, Total: 176 mg/dL (ref 100–199)
HDL: 48 mg/dL (ref >39)
LDL Chol Calc (NIH): 109 mg/dL — ABNORMAL HIGH (ref 0–99)
Triglycerides: 107 mg/dL (ref 0–149)
VLDL Cholesterol Cal: 19 mg/dL (ref 5–40)

## 2022-01-13 NOTE — Progress Notes (Signed)
Please let patient know that her lab work shows that she does still have declined kidney function.  Make sure to avoid Ibuprofen, aleve and motrin as discussed during our visit. Other lab work looks good.  No concerns at this time.

## 2022-01-29 DIAGNOSIS — N1831 Chronic kidney disease, stage 3a: Secondary | ICD-10-CM

## 2022-01-29 DIAGNOSIS — F331 Major depressive disorder, recurrent, moderate: Secondary | ICD-10-CM

## 2022-02-08 ENCOUNTER — Other Ambulatory Visit: Payer: Self-pay | Admitting: Nurse Practitioner

## 2022-02-10 NOTE — Telephone Encounter (Signed)
Requested Prescriptions  Pending Prescriptions Disp Refills  . traZODone (DESYREL) 50 MG tablet [Pharmacy Med Name: TRAZODONE '50MG'$  TABLETS] 30 tablet 0    Sig: TAKE 1/2 TO 1 TABLET(25 TO 50 MG) BY MOUTH AT BEDTIME AS NEEDED FOR SLEEP     Psychiatry: Antidepressants - Serotonin Modulator Passed - 02/08/2022  3:34 AM      Passed - Completed PHQ-2 or PHQ-9 in the last 360 days      Passed - Valid encounter within last 6 months    Recent Outpatient Visits          1 month ago Aortic atherosclerosis ([place])   [facility] Jon Billings, NP   3 months ago Aortic atherosclerosis ([place])   [facility] Jon Billings, NP   7 months ago Annual physical exam   [facility] Jon Billings, NP   10 months ago Encounter for screening breast examination   [facility], DO   1 year ago Aortic atherosclerosis ([place])   [place] Jon Billings, NP      Future Appointments            In 2 days Jon Billings, NP [facility], [place]

## 2022-02-12 ENCOUNTER — Ambulatory Visit: Payer: Medicaid Other | Admitting: Nurse Practitioner

## 2022-02-12 NOTE — Progress Notes (Deleted)
There were no vitals taken for this visit.   Subjective:    Patient ID: Jordan Cantu, female    DOB: May 06, 1951, 71 y.o.   MRN: [id number]  HPI: Jordan Cantu is a 71 y.o. female  No chief complaint on file.   ANXIETY/DEPRESSION Patient states she anxiety is okay.  She uses the Xanax 3x daily.  She feels like she needs it every day TID.  She states she takes 2 at night.  She has tried taking one but it hasn't helped her.  She hasn't tried anything else for sleep.  Does not want to change because it works well for her.  She is willing to try something else for sleep.  She does not feel depressed just not sleeping well.    [place] Office Visit from 01/10/2022 in [place]  PHQ-9 Total Score 21         01/10/2022    1:53 PM 10/14/2021   11:11 AM 07/11/2021    8:23 AM 04/15/2021   11:38 AM  GAD 7 : Generalized Anxiety Score  Nervous, Anxious, on Edge 3 0 2 3  Control/stop worrying 3 0 2 3  Worry too much - different things 3 0 1 3  Trouble relaxing 3 0 1 0  Restless 2 0 1 1  Easily annoyed or irritable 3 0 0 0  Afraid - awful might happen 2 0 1 0  Total GAD 7 Score 19 0 8 10  Anxiety Difficulty Somewhat difficult Not difficult at all Not difficult at all Not difficult at all      Relevant past medical, surgical, family and social history reviewed and updated as indicated. Interim medical history since our last visit reviewed. Allergies and medications reviewed and updated.  Review of Systems  Respiratory:  Negative for shortness of breath.   Psychiatric/Behavioral:  Positive for sleep disturbance. Negative for dysphoric mood. The patient is nervous/anxious.     Per HPI unless specifically indicated above     Objective:    There were no vitals taken for this visit.  Wt Readings from Last 3 Encounters:  01/10/22 134 lb (60.8 kg)  10/14/21 136 lb 12.8 oz (62.1 kg)  09/25/21 125 lb (56.7 kg)    Physical Exam Vitals and nursing  note reviewed.  Constitutional:      General: She is not in acute distress.    Appearance: Normal appearance. She is normal weight. She is not ill-appearing, toxic-appearing or diaphoretic.  HENT:     Head: Normocephalic.     Right Ear: External ear normal.     Left Ear: External ear normal.     Nose: Nose normal.     Mouth/Throat:     Mouth: Mucous membranes are moist.     Pharynx: Oropharynx is clear.  Eyes:     General:        Right eye: No discharge.        Left eye: No discharge.     Extraocular Movements: Extraocular movements intact.     Conjunctiva/sclera: Conjunctivae normal.     Pupils: Pupils are equal, round, and reactive to light.  Cardiovascular:     Rate and Rhythm: Normal rate and regular rhythm.     Heart sounds: No murmur heard. Pulmonary:     Effort: Pulmonary effort is normal. No respiratory distress.     Breath sounds: Normal breath sounds. No wheezing or rales.  Musculoskeletal:     Cervical back: Normal range  of motion and neck supple.  Skin:    General: Skin is warm and dry.     Capillary Refill: Capillary refill takes less than 2 seconds.  Neurological:     General: No focal deficit present.     Mental Status: She is alert and oriented to person, place, and time. Mental status is at baseline.  Psychiatric:        Mood and Affect: Mood normal.        Behavior: Behavior normal.        Thought Content: Thought content normal.        Judgment: Judgment normal.     Results for orders placed or performed in visit on 01/10/22  Comp Met (CMET)  Result Value Ref Range   Glucose 81 70 - 99 mg/dL   BUN 12 8 - 27 mg/dL   Creatinine, Ser 1.32 (H) 0.57 - 1.00 mg/dL   eGFR 43 (L) >59 mL/min/1.73   BUN/Creatinine Ratio 9 (L) 12 - 28   Sodium 137 134 - 144 mmol/L   Potassium 4.6 3.5 - 5.2 mmol/L   Chloride 103 96 - 106 mmol/L   CO2 21 20 - 29 mmol/L   Calcium 9.3 8.7 - 10.3 mg/dL   Total Protein 6.4 6.0 - 8.5 g/dL   Albumin 3.8 3.8 - 4.8 g/dL    Globulin, Total 2.6 1.5 - 4.5 g/dL   Albumin/Globulin Ratio 1.5 1.2 - 2.2   Bilirubin Total 0.2 0.0 - 1.2 mg/dL   Alkaline Phosphatase 107 44 - 121 IU/L   AST 19 0 - 40 IU/L   ALT 12 0 - 32 IU/L  Lipid Profile  Result Value Ref Range   Cholesterol, Total 176 100 - 199 mg/dL   Triglycerides 107 0 - 149 mg/dL   HDL 48 >39 mg/dL   VLDL Cholesterol Cal 19 5 - 40 mg/dL   LDL Chol Calc (NIH) 109 (H) 0 - 99 mg/dL   Chol/HDL Ratio 3.7 0.0 - 4.4 ratio      Assessment & Plan:   Problem List Items Addressed This Visit       Other   Insomnia - Primary     Follow up plan: No follow-ups on file.

## 2022-02-26 ENCOUNTER — Telehealth: Payer: Medicaid Other

## 2022-03-03 ENCOUNTER — Telehealth: Payer: Self-pay | Admitting: Licensed Clinical Social Worker

## 2022-03-03 NOTE — Telephone Encounter (Signed)
    Clinical Social Work  Care Management   Phone Outreach    03/03/2022 Name: Janifer Gieselman MRN: [id number] DOB: 19-Sep-1950  Rakeb Kibble is a 71 y.o. year old female who is a primary care patient of Jon Billings, NP .   Reason for referral: Mental Health Counseling and Resources.    F/U phone call today to assess needs, progress and barriers with care plan goals.   Telephone outreach was unsuccessful. A HIPPA compliant phone message was left for the patient providing contact information and requesting a return call.   Plan:CCM LCSW will wait for return call. If no return call is received, Will route chart to Care Guide to see if patient would like to reschedule phone appointment   Review of patient status, including review of consultants reports, relevant laboratory and other test results, and collaboration with appropriate care team members and the patient's provider was performed as part of comprehensive patient evaluation and provision of care management services.    Christa See, MSW, [email or web address] Phone [phone] 2:41 PM

## 2022-03-05 ENCOUNTER — Telehealth: Payer: Self-pay

## 2022-03-05 NOTE — Chronic Care Management (AMB) (Signed)
  Chronic Care Management Note  03/05/2022 Name: Nariyah Osias MRN: [id number] DOB: 03/22/1951  Shaindy Reader is a 71 y.o. year old female who is a primary care patient of Jon Billings, NP and is actively engaged with the care management team. I reached out to [name] by phone today to assist with re-scheduling a follow up visit with the Licensed Clinical Social Worker  Follow up plan: Unsuccessful telephone outreach attempt made. A HIPAA compliant phone message was left for the patient providing contact information and requesting a return call.  The care management team will reach out to the patient again over the next 7 days.  If patient returns call to provider office, please advise to call [facility]  at [place], [place], [place], [place] [address] Direct Dial: [phone] [email or web address]

## 2022-03-11 NOTE — Chronic Care Management (AMB) (Signed)
  Chronic Care Management Note  03/11/2022 Name: Jordan Cantu MRN: [id number] DOB: Jul 14, 1951  Jordan Cantu is a 71 y.o. year old female who is a primary care patient of Jon Billings, NP and is actively engaged with the care management team. I reached out to [name] by phone today to assist with re-scheduling a follow up visit with the Licensed Clinical Social Worker  Follow up plan: Unsuccessful telephone outreach attempt made. A HIPAA compliant phone message was left for the patient providing contact information and requesting a return call.  The care management team will reach out to the patient again over the next 7 days.  If patient returns call to provider office, please advise to call [facility]  at [place], [place], [place] [address] Direct Dial: [phone] [name]

## 2022-03-19 NOTE — Chronic Care Management (AMB) (Signed)
  Chronic Care Management Note  03/19/2022 Name: Jordan Cantu MRN: [id number] DOB: 1950-12-04  Jordan Cantu is a 71 y.o. year old female who is a primary care patient of Jon Billings, NP and is actively engaged with the care management team. I reached out to [name] by phone today to assist with re-scheduling a follow up visit with the Licensed Clinical Social Worker  Follow up plan: Unable to make contact on outreach attempts x 3. PCP Jon Billings, NP notified via routed documentation in medical record.   Noreene Larsson, [place], [place] [address] Direct Dial: [phone] [email or web address]

## 2022-04-09 ENCOUNTER — Other Ambulatory Visit: Payer: Self-pay | Admitting: Nurse Practitioner

## 2022-04-09 NOTE — Telephone Encounter (Signed)
Medication was discontinued 01/10/2022. Requested Prescriptions  Pending Prescriptions Disp Refills  . amitriptyline (ELAVIL) 100 MG tablet [Pharmacy Med Name: AMITRIPTYLINE '100MG'$  (HUNDRED MG)TAB] 90 tablet 1    Sig: TAKE 1 TABLET(100 MG) BY MOUTH AT BEDTIME     Psychiatry:  Antidepressants - Heterocyclics (TCAs) Passed - 04/09/2022  3:36 AM      Passed - Completed PHQ-2 or PHQ-9 in the last 360 days      Passed - Valid encounter within last 6 months    Recent Outpatient Visits          2 months ago Aortic atherosclerosis ([place])   [facility] Jon Billings, NP   5 months ago Aortic atherosclerosis [facility])   [facility] Jon Billings, NP   9 months ago Annual physical exam   [facility] Jon Billings, NP   11 months ago Encounter for screening breast examination   [place], DO   1 year ago Aortic atherosclerosis [facility])   [facility] Jon Billings, NP

## 2022-06-04 ENCOUNTER — Ambulatory Visit: Payer: Medicaid Other

## 2022-07-21 ENCOUNTER — Ambulatory Visit: Payer: Medicaid Other

## 2022-09-22 ENCOUNTER — Ambulatory Visit: Payer: Medicaid Other

## 2022-11-17 ENCOUNTER — Telehealth: Payer: Self-pay | Admitting: Nurse Practitioner

## 2022-11-17 NOTE — Telephone Encounter (Signed)
Copied from [place] [phone]. Topic: Medicare AWV >> Nov 17, 2022  1:32 PM Devoria Glassing wrote: Kirke Shaggy, LPN Reason for CRM: Called patient to schedule Medicare Annual Wellness Visit (AWV). Left message for patient to call back and schedule Medicare Annual Wellness Visit (AWV).  Last date of AWV: 05/31/2021  Please schedule an appointment at any time with Kirke Shaggy, LPN .  If any questions, please contact me.  Thank you ,  Sherol Dade; [place] Direct Dial: [phone]

## 2022-12-30 ENCOUNTER — Ambulatory Visit: Payer: Medicare Other | Admitting: Family Medicine

## 2023-07-02 ENCOUNTER — Encounter: Payer: Self-pay | Admitting: Nurse Practitioner

## 2023-10-10 DIAGNOSIS — R4689 Other symptoms and signs involving appearance and behavior: Secondary | ICD-10-CM | POA: Diagnosis not present

## 2023-10-10 DIAGNOSIS — Z720 Tobacco use: Secondary | ICD-10-CM | POA: Diagnosis not present

## 2023-10-10 DIAGNOSIS — G9340 Encephalopathy, unspecified: Secondary | ICD-10-CM | POA: Diagnosis not present

## 2023-10-10 DIAGNOSIS — I10 Essential (primary) hypertension: Secondary | ICD-10-CM | POA: Diagnosis not present

## 2023-10-10 DIAGNOSIS — R918 Other nonspecific abnormal finding of lung field: Secondary | ICD-10-CM | POA: Diagnosis not present

## 2023-10-10 DIAGNOSIS — R4182 Altered mental status, unspecified: Secondary | ICD-10-CM | POA: Diagnosis not present

## 2023-10-10 DIAGNOSIS — I700 Atherosclerosis of aorta: Secondary | ICD-10-CM | POA: Diagnosis not present

## 2023-10-10 DIAGNOSIS — E876 Hypokalemia: Secondary | ICD-10-CM | POA: Diagnosis not present

## 2023-10-10 DIAGNOSIS — I672 Cerebral atherosclerosis: Secondary | ICD-10-CM | POA: Diagnosis not present

## 2023-10-10 DIAGNOSIS — E46 Unspecified protein-calorie malnutrition: Secondary | ICD-10-CM | POA: Diagnosis not present

## 2023-10-10 DIAGNOSIS — F1090 Alcohol use, unspecified, uncomplicated: Secondary | ICD-10-CM | POA: Diagnosis not present

## 2023-10-10 DIAGNOSIS — E785 Hyperlipidemia, unspecified: Secondary | ICD-10-CM | POA: Diagnosis not present

## 2023-10-10 DIAGNOSIS — R4189 Other symptoms and signs involving cognitive functions and awareness: Secondary | ICD-10-CM | POA: Diagnosis not present

## 2023-10-10 DIAGNOSIS — I6782 Cerebral ischemia: Secondary | ICD-10-CM | POA: Diagnosis not present

## 2023-10-10 DIAGNOSIS — R001 Bradycardia, unspecified: Secondary | ICD-10-CM | POA: Diagnosis not present

## 2023-10-11 DIAGNOSIS — I6782 Cerebral ischemia: Secondary | ICD-10-CM | POA: Diagnosis not present

## 2023-10-11 DIAGNOSIS — R410 Disorientation, unspecified: Secondary | ICD-10-CM | POA: Diagnosis not present

## 2023-10-11 DIAGNOSIS — R918 Other nonspecific abnormal finding of lung field: Secondary | ICD-10-CM | POA: Diagnosis not present

## 2023-10-11 DIAGNOSIS — I700 Atherosclerosis of aorta: Secondary | ICD-10-CM | POA: Diagnosis not present

## 2023-10-11 DIAGNOSIS — I672 Cerebral atherosclerosis: Secondary | ICD-10-CM | POA: Diagnosis not present

## 2023-10-11 DIAGNOSIS — R001 Bradycardia, unspecified: Secondary | ICD-10-CM | POA: Diagnosis not present

## 2023-10-11 DIAGNOSIS — R4189 Other symptoms and signs involving cognitive functions and awareness: Secondary | ICD-10-CM | POA: Diagnosis not present

## 2023-10-11 DIAGNOSIS — R8281 Pyuria: Secondary | ICD-10-CM | POA: Diagnosis not present

## 2023-10-11 DIAGNOSIS — Z720 Tobacco use: Secondary | ICD-10-CM | POA: Diagnosis not present

## 2023-10-11 DIAGNOSIS — G9340 Encephalopathy, unspecified: Secondary | ICD-10-CM | POA: Diagnosis not present

## 2023-10-11 DIAGNOSIS — E876 Hypokalemia: Secondary | ICD-10-CM | POA: Diagnosis not present

## 2023-10-11 DIAGNOSIS — E46 Unspecified protein-calorie malnutrition: Secondary | ICD-10-CM | POA: Diagnosis not present

## 2023-10-11 DIAGNOSIS — E538 Deficiency of other specified B group vitamins: Secondary | ICD-10-CM | POA: Diagnosis not present

## 2023-10-11 DIAGNOSIS — E785 Hyperlipidemia, unspecified: Secondary | ICD-10-CM | POA: Diagnosis not present

## 2023-10-11 DIAGNOSIS — Z7982 Long term (current) use of aspirin: Secondary | ICD-10-CM | POA: Diagnosis not present

## 2023-10-11 DIAGNOSIS — Z681 Body mass index (BMI) 19 or less, adult: Secondary | ICD-10-CM | POA: Diagnosis not present

## 2023-10-11 DIAGNOSIS — R4689 Other symptoms and signs involving appearance and behavior: Secondary | ICD-10-CM | POA: Diagnosis not present

## 2023-10-11 DIAGNOSIS — I10 Essential (primary) hypertension: Secondary | ICD-10-CM | POA: Diagnosis not present

## 2023-10-11 DIAGNOSIS — E7800 Pure hypercholesterolemia, unspecified: Secondary | ICD-10-CM | POA: Diagnosis not present

## 2023-10-11 DIAGNOSIS — Z8673 Personal history of transient ischemic attack (TIA), and cerebral infarction without residual deficits: Secondary | ICD-10-CM | POA: Diagnosis not present

## 2023-10-11 DIAGNOSIS — F17210 Nicotine dependence, cigarettes, uncomplicated: Secondary | ICD-10-CM | POA: Diagnosis not present

## 2023-10-11 DIAGNOSIS — R4182 Altered mental status, unspecified: Secondary | ICD-10-CM | POA: Diagnosis not present

## 2023-10-11 DIAGNOSIS — R4181 Age-related cognitive decline: Secondary | ICD-10-CM | POA: Diagnosis not present

## 2023-10-11 DIAGNOSIS — F1090 Alcohol use, unspecified, uncomplicated: Secondary | ICD-10-CM | POA: Diagnosis not present

## 2023-10-11 DIAGNOSIS — E43 Unspecified severe protein-calorie malnutrition: Secondary | ICD-10-CM | POA: Diagnosis not present

## 2023-10-12 DIAGNOSIS — E876 Hypokalemia: Secondary | ICD-10-CM | POA: Diagnosis not present

## 2023-10-12 DIAGNOSIS — E43 Unspecified severe protein-calorie malnutrition: Secondary | ICD-10-CM | POA: Diagnosis not present

## 2023-10-12 DIAGNOSIS — I10 Essential (primary) hypertension: Secondary | ICD-10-CM | POA: Diagnosis not present

## 2023-10-12 DIAGNOSIS — Z720 Tobacco use: Secondary | ICD-10-CM | POA: Diagnosis not present

## 2023-10-12 DIAGNOSIS — R8281 Pyuria: Secondary | ICD-10-CM | POA: Diagnosis not present

## 2023-10-12 DIAGNOSIS — E785 Hyperlipidemia, unspecified: Secondary | ICD-10-CM | POA: Diagnosis not present

## 2023-10-12 DIAGNOSIS — Z8673 Personal history of transient ischemic attack (TIA), and cerebral infarction without residual deficits: Secondary | ICD-10-CM | POA: Diagnosis not present

## 2023-10-12 DIAGNOSIS — Z789 Other specified health status: Secondary | ICD-10-CM | POA: Diagnosis not present

## 2023-10-23 DIAGNOSIS — K5900 Constipation, unspecified: Secondary | ICD-10-CM | POA: Diagnosis not present

## 2023-10-23 DIAGNOSIS — E785 Hyperlipidemia, unspecified: Secondary | ICD-10-CM | POA: Diagnosis not present

## 2023-10-23 DIAGNOSIS — E559 Vitamin D deficiency, unspecified: Secondary | ICD-10-CM | POA: Diagnosis not present

## 2023-10-23 DIAGNOSIS — E43 Unspecified severe protein-calorie malnutrition: Secondary | ICD-10-CM | POA: Diagnosis not present

## 2023-10-23 DIAGNOSIS — R0902 Hypoxemia: Secondary | ICD-10-CM | POA: Diagnosis not present

## 2023-10-23 DIAGNOSIS — F32A Depression, unspecified: Secondary | ICD-10-CM | POA: Diagnosis not present

## 2023-10-23 DIAGNOSIS — F17210 Nicotine dependence, cigarettes, uncomplicated: Secondary | ICD-10-CM | POA: Diagnosis not present

## 2023-10-23 DIAGNOSIS — R410 Disorientation, unspecified: Secondary | ICD-10-CM | POA: Diagnosis not present

## 2023-10-23 DIAGNOSIS — R45851 Suicidal ideations: Secondary | ICD-10-CM | POA: Diagnosis not present

## 2023-10-23 DIAGNOSIS — R9431 Abnormal electrocardiogram [ECG] [EKG]: Secondary | ICD-10-CM | POA: Diagnosis not present

## 2023-10-23 DIAGNOSIS — G3184 Mild cognitive impairment, so stated: Secondary | ICD-10-CM | POA: Diagnosis not present

## 2023-10-23 DIAGNOSIS — R000 Tachycardia, unspecified: Secondary | ICD-10-CM | POA: Diagnosis not present

## 2023-10-23 DIAGNOSIS — Z8673 Personal history of transient ischemic attack (TIA), and cerebral infarction without residual deficits: Secondary | ICD-10-CM | POA: Diagnosis not present

## 2023-10-23 DIAGNOSIS — E7800 Pure hypercholesterolemia, unspecified: Secondary | ICD-10-CM | POA: Diagnosis not present

## 2023-10-23 DIAGNOSIS — E538 Deficiency of other specified B group vitamins: Secondary | ICD-10-CM | POA: Diagnosis not present

## 2023-10-23 DIAGNOSIS — I10 Essential (primary) hypertension: Secondary | ICD-10-CM | POA: Diagnosis not present

## 2023-10-27 DIAGNOSIS — E559 Vitamin D deficiency, unspecified: Secondary | ICD-10-CM | POA: Diagnosis not present

## 2023-10-27 DIAGNOSIS — E876 Hypokalemia: Secondary | ICD-10-CM | POA: Diagnosis not present

## 2023-10-27 DIAGNOSIS — Z87891 Personal history of nicotine dependence: Secondary | ICD-10-CM | POA: Diagnosis not present

## 2023-10-27 DIAGNOSIS — Z7689 Persons encountering health services in other specified circumstances: Secondary | ICD-10-CM | POA: Diagnosis not present

## 2023-10-27 DIAGNOSIS — E43 Unspecified severe protein-calorie malnutrition: Secondary | ICD-10-CM | POA: Diagnosis not present

## 2023-10-27 DIAGNOSIS — R4189 Other symptoms and signs involving cognitive functions and awareness: Secondary | ICD-10-CM | POA: Diagnosis not present

## 2023-10-27 DIAGNOSIS — Z720 Tobacco use: Secondary | ICD-10-CM | POA: Diagnosis not present

## 2023-10-27 DIAGNOSIS — Z8673 Personal history of transient ischemic attack (TIA), and cerebral infarction without residual deficits: Secondary | ICD-10-CM | POA: Diagnosis not present

## 2023-10-27 DIAGNOSIS — E538 Deficiency of other specified B group vitamins: Secondary | ICD-10-CM | POA: Diagnosis not present

## 2023-11-24 DIAGNOSIS — R4189 Other symptoms and signs involving cognitive functions and awareness: Secondary | ICD-10-CM | POA: Diagnosis not present

## 2023-11-24 DIAGNOSIS — E7800 Pure hypercholesterolemia, unspecified: Secondary | ICD-10-CM | POA: Diagnosis not present

## 2023-11-24 DIAGNOSIS — Z7689 Persons encountering health services in other specified circumstances: Secondary | ICD-10-CM | POA: Diagnosis not present

## 2023-11-24 DIAGNOSIS — E559 Vitamin D deficiency, unspecified: Secondary | ICD-10-CM | POA: Diagnosis not present

## 2023-11-27 DIAGNOSIS — F17210 Nicotine dependence, cigarettes, uncomplicated: Secondary | ICD-10-CM | POA: Diagnosis not present

## 2023-11-27 DIAGNOSIS — Z87891 Personal history of nicotine dependence: Secondary | ICD-10-CM | POA: Diagnosis not present

## 2023-12-22 DIAGNOSIS — R911 Solitary pulmonary nodule: Secondary | ICD-10-CM | POA: Diagnosis not present

## 2023-12-24 DIAGNOSIS — R918 Other nonspecific abnormal finding of lung field: Secondary | ICD-10-CM | POA: Diagnosis not present

## 2023-12-24 DIAGNOSIS — M47812 Spondylosis without myelopathy or radiculopathy, cervical region: Secondary | ICD-10-CM | POA: Diagnosis not present

## 2023-12-24 DIAGNOSIS — N858 Other specified noninflammatory disorders of uterus: Secondary | ICD-10-CM | POA: Diagnosis not present

## 2023-12-24 DIAGNOSIS — R937 Abnormal findings on diagnostic imaging of other parts of musculoskeletal system: Secondary | ICD-10-CM | POA: Diagnosis not present

## 2024-01-07 DIAGNOSIS — E7800 Pure hypercholesterolemia, unspecified: Secondary | ICD-10-CM | POA: Diagnosis not present

## 2024-01-07 DIAGNOSIS — E559 Vitamin D deficiency, unspecified: Secondary | ICD-10-CM | POA: Diagnosis not present

## 2024-01-26 DIAGNOSIS — I6789 Other cerebrovascular disease: Secondary | ICD-10-CM | POA: Diagnosis not present

## 2024-01-26 DIAGNOSIS — R4189 Other symptoms and signs involving cognitive functions and awareness: Secondary | ICD-10-CM | POA: Diagnosis not present

## 2024-02-16 DIAGNOSIS — E559 Vitamin D deficiency, unspecified: Secondary | ICD-10-CM | POA: Diagnosis not present

## 2024-02-16 DIAGNOSIS — Z7689 Persons encountering health services in other specified circumstances: Secondary | ICD-10-CM | POA: Diagnosis not present

## 2024-02-16 DIAGNOSIS — R4189 Other symptoms and signs involving cognitive functions and awareness: Secondary | ICD-10-CM | POA: Diagnosis not present

## 2024-02-16 DIAGNOSIS — R29898 Other symptoms and signs involving the musculoskeletal system: Secondary | ICD-10-CM | POA: Diagnosis not present

## 2024-02-16 DIAGNOSIS — Z0000 Encounter for general adult medical examination without abnormal findings: Secondary | ICD-10-CM | POA: Diagnosis not present

## 2024-02-16 DIAGNOSIS — E7800 Pure hypercholesterolemia, unspecified: Secondary | ICD-10-CM | POA: Diagnosis not present

## 2024-02-16 DIAGNOSIS — Z720 Tobacco use: Secondary | ICD-10-CM | POA: Diagnosis not present
